# Patient Record
Sex: Male | Born: 1975 | Race: White | Hispanic: No | Marital: Married | Smoking: Never smoker
Health system: Southern US, Community
[De-identification: ages and names within clinical notes are randomized; demographics above are authoritative.]

## PROBLEM LIST (undated history)

## (undated) DIAGNOSIS — J45909 Unspecified asthma, uncomplicated: Secondary | ICD-10-CM

## (undated) DIAGNOSIS — M549 Dorsalgia, unspecified: Secondary | ICD-10-CM

## (undated) DIAGNOSIS — T7840XA Allergy, unspecified, initial encounter: Secondary | ICD-10-CM

## (undated) HISTORY — DX: Allergy, unspecified, initial encounter: T78.40XA

## (undated) HISTORY — DX: Unspecified asthma, uncomplicated: J45.909

## (undated) HISTORY — DX: Dorsalgia, unspecified: M54.9

## (undated) HISTORY — PX: TONSILLECTOMY: SUR1361

---

## 2014-07-18 ENCOUNTER — Encounter: Payer: Self-pay | Admitting: Rehabilitative and Restorative Service Providers"

## 2014-07-18 ENCOUNTER — Ambulatory Visit (INDEPENDENT_AMBULATORY_CARE_PROVIDER_SITE_OTHER): Payer: Managed Care, Other (non HMO) | Admitting: Rehabilitative and Restorative Service Providers"

## 2014-07-18 DIAGNOSIS — M623 Immobility syndrome (paraplegic): Secondary | ICD-10-CM

## 2014-07-18 DIAGNOSIS — Z7409 Other reduced mobility: Secondary | ICD-10-CM | POA: Diagnosis not present

## 2014-07-18 DIAGNOSIS — M5410 Radiculopathy, site unspecified: Secondary | ICD-10-CM

## 2014-07-18 DIAGNOSIS — M2560 Stiffness of unspecified joint, not elsewhere classified: Secondary | ICD-10-CM

## 2014-07-18 NOTE — Therapy (Signed)
[facility] [address] [place], [place], [address] Phone: [phone]   Fax:  [phone]  Physical Therapy Evaluation  Patient Details  Name: Tyler CapriceKier Tyler Chambers MRN: [id number] Date of Birth: [birth year] Referring Provider:  [place]il[place], Betty G, MD  Encounter Date: 07/18/2014      PT End of Session - 07/18/14 0718    Visit Number 1   Number of Visits 6   Date for PT Re-Evaluation 09/12/14   PT Start Time 0719   PT Stop Time 0809   PT Time Calculation (min) 50 min   Activity Tolerance Patient tolerated treatment well   Behavior During Therapy [facility] for tasks assessed/performed      History reviewed. No pertinent past medical history.  History reviewed. No pertinent past surgical history.  There were no vitals filed for this visit.  Visit Diagnosis:  Radicular pain of lower extremity - Plan: PT plan of care cert/re-cert  Stiffness due to immobility - Plan: PT plan of care cert/re-cert  Decreased functional mobility and endurance - Plan: PT plan of care cert/re-cert      Subjective Assessment - 07/18/14 0719    Subjective Patient reports onset of Lt leg pain and numbness in the back of his Lt LE mid May. Symptoms have continued since that time. He seen by MD and treated with predisone and Meloxicam with good resolution of pain but numbness continues. Numbness is intermittent more noticable in the morning.   Pertinent History Denies any medical problems - does have asthma   Limitations Sitting   How long can you sit comfortably? In the am ~15 min   How long can you stand comfortably? no limit   How long can you walk comfortably? no limit   Diagnostic tests none   Patient Stated Goals Get rid of pain and numbness in his Lt leg   Currently in Pain? Yes   Pain Score 2    Pain Location Leg   Pain Orientation Left;Posterior   Pain Descriptors / Indicators Aching;Tightness;Numbness   Pain Type Acute pain   Pain Radiating  Towards hip to leg to plantar surface of foot   Pain Onset More than a month ago   Aggravating Factors  sitting on stool or sofa/car seat   Pain Relieving Factors changing positions    Effect of Pain on Daily Activities has to get up and change positions frequently - avoids sitting   Multiple Pain Sites No            [facility] PT Assessment - 07/18/14 0001    Assessment   Medical Diagnosis Lt LE radicular pain   Onset Date/Surgical Date 05/27/14   Hand Dominance Right   Next MD Visit no scheduled appt   Balance Screen   Has the patient fallen in the past 6 months No   Has the patient had a decrease in activity level because of a fear of falling?  No   Is the patient reluctant to leave their home because of a fear of falling?  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   Leisure Some household chores/camping/hiking/walking/some exercise    Observation/Other Assessments   Focus on Therapeutic Outcomes (FOTO)  2% limitation   Sensation   Additional Comments sensation intact to light  pressure   Posture/Postural Control   Posture Comments slight head forward posture with scapulae abducted and rotated along the toracic spine; UE's internally rotated at sides; symmetrical boney landmarks   AROM   Overall AROM Comments ROM WFL's throughout Bilat LE's tightness in hamstrings at 80 degrees Lt, 85 degrees Rt   Lumbar Flexion 80%   Lumbar Extension 75%   Lumbar - Right Side Bend 85%   Lumbar - Left Side Bend 85%   Lumbar - Right Rotation 80%   Lumbar - Left Rotation 80%   Strength   Overall Strength Comments WFL's throughout   Flexibility   Soft Tissue Assessment /Muscle Length --  tight piriformis Lt > Rt   Palpation   Palpation comment PA  spring testing negative          [facility] Adult PT Treatment/Exercise - 07/18/14 0001    Lumbar Exercises: Stretches   Passive Hamstring Stretch 3 reps;30 seconds   Press Ups --  10 reps 1-2 sec pause   Piriformis Stretch 3 reps;20 seconds   Lumbar Exercises: Standing   Other Standing Lumbar Exercises trunk extension 5-10 reps    Modalities   Modalities Cryotherapy   Cryotherapy   Number Minutes Cryotherapy 12 Minutes   Cryotherapy Location Lumbar Spine   Type of Cryotherapy Ice pack            PT Education - 07/18/14 0809    Education provided Yes   Education Details Educatioin re- back pain and radicular symptoms; back care; ergonomics; HEP   Person(s) Educated Patient   Methods Explanation;Demonstration;Tactile cues;Verbal cues;Handout   Comprehension Verbalized understanding;Returned demonstration;Verbal cues required;Tactile cues required          PT Short Term Goals - 07/18/14 0834    PT SHORT TERM GOAL #1   Title Patient I in HEP(08/08/14)   Time 3   Period Weeks   Status New   PT SHORT TERM GOAL #2   Title Patient reports modifications in work positions to [employer] improved ergonomic postures(08/08/14)   Time 3   Period Weeks   Status New           PT Long Term Goals - 07/18/14 0836    PT LONG TERM GOAL #1   Title Patient I in advance HEP including core stabilization and stretching(09/12/14)   Time 6   Period Weeks   Status New   PT LONG TERM GOAL #2   Title Resolution of radicular Lt LE pain(09/12/14)   Time 6   Period Weeks   Status New   PT LONG TERM GOAL #3   Title Maintain FOTO score(09/12/14)             Plan - 07/18/14 0829    Clinical Impression Statement Patient presents with 2 month history of Lt LE radicular pain and numbness with signs and symptoms indicative of discogenic origin. He has decreased pain with lumbar extension in standing and prone; hamstring/piriformis tightness Lt > Rt; poor work postures and positions    Pt will benefit from skilled therapeutic intervention in order to improve on the following deficits Decreased range of motion;Postural dysfunction;Improper body mechanics;Pain   Rehab Potential Excellent   PT Frequency 1x / week   PT Duration 6 weeks   PT Treatment/Interventions ADLs/Self Care Home Management;Patient/family education;Therapeutic activities;Therapeutic exercise;Neuromuscular re-education;Manual techniques;Moist Heat;Ultrasound;Cryotherapy;Electrical Stimulation   PT Next Visit Plan Review HEP; progress with core stabilization for lumbar spine; continue education re- back care   PT Home Exercise Plan Postural  changes; positonal correction for work at home; standing trunk extension for onset of radicular symptoms; HEP   Consulted and Agree with Plan of Care Patient         Problem List There are no active problems to display for this patient.   Val Riles, PT, MPH 07/18/2014, 8:46 AM  [facility] [address] [place], [place], [address] Phone: [phone]   Fax:  [phone]

## 2014-07-18 NOTE — Patient Instructions (Signed)
"  Let's Twist" Stretch   With both legs bent and feet flat on floor, cross one leg over. Keeping both shoulders on floor, pull knee across opposite leg. Hold 20 sec. Repeat _3___ times. Do ____ sessions per day.   Hamstring Step 1   Straighten left knee. Keep knee level with other knee or on bolster. Hold _30__ seconds. Relax knee by returning foot to start. Repeat 3___ times.     Trunk Extension   Standing, place back of open hands on low back. Straighten spine then arch the back and move shoulders back. Repeat _5- 10___ times per session. Do ____ sessions per week. Variation: Seated   Trunk: Prone Extension (Press-Ups)   Lie on stomach on firm, flat surface. Relax bottom and legs. Raise chest in air with elbows straight. Keep hips flat on surface, sag stomach. Hold _2-3___ seconds. Repeat _10__ times. Do _2-3___ sessions per day. CAUTION: Movement should be gentle and slow.  .Marland Kitchen

## 2014-07-30 ENCOUNTER — Encounter: Payer: Self-pay | Admitting: Rehabilitative and Restorative Service Providers"

## 2014-07-30 ENCOUNTER — Ambulatory Visit (INDEPENDENT_AMBULATORY_CARE_PROVIDER_SITE_OTHER): Payer: Managed Care, Other (non HMO) | Admitting: Rehabilitative and Restorative Service Providers"

## 2014-07-30 ENCOUNTER — Encounter (INDEPENDENT_AMBULATORY_CARE_PROVIDER_SITE_OTHER): Payer: Self-pay

## 2014-07-30 DIAGNOSIS — M5410 Radiculopathy, site unspecified: Secondary | ICD-10-CM

## 2014-07-30 DIAGNOSIS — M623 Immobility syndrome (paraplegic): Secondary | ICD-10-CM | POA: Diagnosis not present

## 2014-07-30 DIAGNOSIS — M2560 Stiffness of unspecified joint, not elsewhere classified: Secondary | ICD-10-CM

## 2014-07-30 DIAGNOSIS — Z7409 Other reduced mobility: Secondary | ICD-10-CM

## 2014-07-30 NOTE — Patient Instructions (Signed)
Sag with prone press up  Abdominal Bracing With Pelvic Floor (Hook-Lying) 3 part core   With neutral spine, tighten pelvic floor and abdominals.Hold 10 sec  Repeat _10__ times. Do _several__ times a day. Transition to sitting and standing     Bridging   Slowly raise buttocks from floor, keeping stomach tight. Hold 5 sec Repeat __10__ times per set.  Do _1___ sessions per day.     Strengthening: Wall Slide   Leaning on wall, slowly lower buttocks until thighs are parallel to floor. Hold _20-30___ seconds. Tighten thigh muscles and return. Repeat _10___ times per set.  Do _1___ sessions per day.  .  Weight on legs not arms with doorway stretches    Scapula Adduction With Pectorals, Low   Stand in doorframe with palms against frame and arms at 45. Lean forward and squeeze shoulder blades. Hold __30_ seconds. Repeat _3__ times per session. Do __3-4 sessions per day.     Scapula Adduction With Pectorals, Mid-Range   Stand in doorframe with palms against frame and arms at 90. Lean forward and squeeze shoulder blades. Hold _30_ seconds. Repeat _3__ times per session. Do _3-4__ sessions per day.   \Scapula Adduction With Pectorals, High   Stand in doorframe with palms against frame and arms at 120. Lean forward and squeeze shoulder blades. Hold _30__ seconds. Repeat 3___ times per session. Do _3-4__ sessions per day.

## 2014-07-30 NOTE — Therapy (Signed)
[place], [place], [address] Phone: [phone]   Fax:  [phone]  Physical Therapy Treatment  Patient Details  Name: Tyler Chambers MRN: [id number] Date of Birth: 1975-12-17 Referring Provider:  [place], Betty G, MD  Encounter Date: 07/30/2014      PT End of Session - 07/30/14 1226    Visit Number 2   Number of Visits 6   Date for PT Re-Evaluation 09/12/14   PT Start Time 0704   PT Stop Time 0733   PT Time Calculation (min) 29 min   Activity Tolerance Patient tolerated treatment well;No increased pain      History reviewed. No pertinent past medical history.  History reviewed. No pertinent past surgical history.  There were no vitals filed for this visit.  Visit Diagnosis:  Radicular pain of lower extremity  Stiffness due to immobility  Decreased functional mobility and endurance      Subjective Assessment - 07/30/14 0706    Subjective Patient reports that his symptoms are better when he maintains good posture and alignment. He notices that symproms return when he slumps - for example when sitting at his laptop on the sofa. Made some modifications with positions. Feels ready for discharge from PT and will continue with I HEP    Limitations Sitting   How long can you sit comfortably? in good posture - 20-30 min   Currently in Pain? No/denies            [facility] PT Assessment - 07/30/14 0001    Observation/Other Assessments   Focus on Therapeutic Outcomes (FOTO)  12% limitation    AROM   Overall AROM Comments hamstring tightness~ 85 degrees bilat   Lumbar Flexion 85%   Lumbar Extension 80%                     [facility] Adult PT Treatment/Exercise - 07/30/14 0001    Posture/Postural Control   Posture Comments improved standing and sitting posture observed   Lumbar Exercises: Stretches   Passive Hamstring Stretch 3 reps;30 seconds   Press Ups --  10 reps 1-2 sec pause    Press Ups Limitations with sag at full extension   Piriformis Stretch Limitations doorway pec stretch 3 positions 20-30 sec hold 3 reps each position    Lumbar Exercises: Standing   Wall Slides 10 reps;5 seconds   Other Standing Lumbar Exercises trunk extension 5-10 reps    Lumbar Exercises: Supine   AB Set Limitations 3 part core 10 x10 sec   Bridge 10 reps;5 seconds                PT Education - 07/30/14 1225    Education provided Yes   Education Details continued education re- spine health; exercise; HEP   Person(s) Educated Patient   Methods Explanation;Demonstration;Tactile cues;Verbal cues;Handout   Comprehension Verbalized understanding;Returned demonstration;Verbal cues required;Tactile cues required          PT Short Term Goals - 07/30/14 1230    PT SHORT TERM GOAL #1   Title Patient I in HEP(08/08/14)   Time 3   Period Weeks   Status Achieved   PT SHORT TERM GOAL #2   Title Patient reports modifications in work positions to [employer] improved ergonomic postures(08/08/14)   Time 3   Period Weeks   Status Achieved           PT Long Term Goals - 07/30/14 1230    PT LONG  TERM GOAL #1   Title Patient I in advance HEP including core stabilization and stretching(09/12/14)   Baseline Patient instructed in core stabilization and will continue with HEP   Time 6   Period Weeks   Status On-going   PT LONG TERM GOAL #2   Title Resolution of radicular Lt LE pain(09/12/14)   Baseline Decreased frequency, intensity and duration of radicular LE pain.   Time 6   Period Weeks   Status On-going   PT LONG TERM GOAL #3   Title Maintain FOTO score(09/12/14)   Status Not Met               Plan - 07/30/14 1226    Clinical Impression Statement Patient reports good improvement in LBP and radicular pain into LE with exercises and remembering to correct spinal posture. He has less frezuent symptoms and can relieve symptoms quickly when they occur. He is  confident in continuing with I HEP/postural correction. He added lumbar stabilization to home program today.   Pt will benefit from skilled therapeutic intervention in order to improve on the following deficits Decreased range of motion;Postural dysfunction;Improper body mechanics;Pain   Rehab Potential Excellent   PT Frequency 1x / week   PT Duration 6 weeks   PT Treatment/Interventions ADLs/Self Care Home Management;Patient/family education;Therapeutic activities;Therapeutic exercise;Neuromuscular re-education;Manual techniques;Moist Heat;Ultrasound;Cryotherapy;Electrical Stimulation   PT Next Visit Plan D/C to I HEP patient will call with any questions or problems.    PT Home Exercise Plan Postural changes; positonal correction for work at home; standing trunk extension for onset of radicular symptoms; added core stabilization; HEP   Consulted and Agree with Plan of Care Patient        Problem List There are no active problems to display for this patient.   Everardo All, PT, MPH 07/30/2014, 12:32 PM  [facility] [place], [place], [address] Phone: [phone]   Fax:  [phone]  PHYSICAL THERAPY DISCHARGE SUMMARY  Visits from Start of Care: 2  Current functional level related to goals / functional outcomes: Patient I in HEP and feels confident in continuing with home program to address lumbar dysfunction and radicular symptoms   Remaining deficits: Continues to have intermittent radicular pain but frequency, intensity and duration are decreased. Patient hs made ergonomic corrections and is working on his [employer] / Equipment: HEP  Plan: Patient agrees to discharge.  Patient goals were partially met. Patient is being discharged due to the patient's request.  ?????   Taelor Waymire P.Helene Kelp, PT, MPH 07/30/14 12:36 pm

## 2015-06-13 ENCOUNTER — Ambulatory Visit: Payer: Managed Care, Other (non HMO) | Admitting: Family Medicine

## 2016-07-20 ENCOUNTER — Encounter: Payer: Self-pay | Admitting: Family Medicine

## 2016-07-20 ENCOUNTER — Ambulatory Visit (INDEPENDENT_AMBULATORY_CARE_PROVIDER_SITE_OTHER): Payer: 59 | Admitting: Family Medicine

## 2016-07-20 ENCOUNTER — Ambulatory Visit (INDEPENDENT_AMBULATORY_CARE_PROVIDER_SITE_OTHER)
Admission: RE | Admit: 2016-07-20 | Discharge: 2016-07-20 | Disposition: A | Discharge status: Home / Self Care | Payer: 59 | Source: Ambulatory Visit | Attending: Family Medicine | Admitting: Family Medicine

## 2016-07-20 VITALS — BP 110/70 | HR 53 | Resp 12 | Ht 73.0 in | Wt 207.1 lb

## 2016-07-20 DIAGNOSIS — R001 Bradycardia, unspecified: Secondary | ICD-10-CM

## 2016-07-20 DIAGNOSIS — M549 Dorsalgia, unspecified: Secondary | ICD-10-CM

## 2016-07-20 DIAGNOSIS — L219 Seborrheic dermatitis, unspecified: Secondary | ICD-10-CM | POA: Insufficient documentation

## 2016-07-20 DIAGNOSIS — G8929 Other chronic pain: Secondary | ICD-10-CM | POA: Insufficient documentation

## 2016-07-20 DIAGNOSIS — M545 Low back pain: Secondary | ICD-10-CM

## 2016-07-20 DIAGNOSIS — J4520 Mild intermittent asthma, uncomplicated: Secondary | ICD-10-CM | POA: Diagnosis not present

## 2016-07-20 MED ORDER — CLOTRIMAZOLE-BETAMETHASONE 1-0.05 % EX CREA: 1.0000 "application " | TOPICAL_CREAM | Freq: Two times a day (BID) | CUTANEOUS | 1 refills | Status: DC

## 2016-07-20 MED ORDER — KETOCONAZOLE 2 % EX SHAM: 1.0000 "application " | MEDICATED_SHAMPOO | CUTANEOUS | 1 refills | Status: AC

## 2016-07-20 MED ORDER — ALBUTEROL SULFATE HFA 108 (90 BASE) MCG/ACT IN AERS: 2.0000 | INHALATION_SPRAY | Freq: Four times a day (QID) | RESPIRATORY_TRACT | 2 refills | Status: DC | PRN

## 2016-07-20 NOTE — Patient Instructions (Addendum)
A few things to remember from today's visit:   Bradycardia - Plan: EKG 12-Lead  Chronic bilateral low back pain, with sciatica presence unspecified - Plan: DG Lumbar Spine Complete  Asthma, intermittent, uncomplicated - Plan: albuterol (PROVENTIL HFA;VENTOLIN HFA) 108 (90 Base) MCG/ACT inhaler  Seborrheic dermatitis - Plan: clotrimazole-betamethasone (LOTRISONE) cream, ketoconazole (NIZORAL) 2 % shampoo  Sinus bradycardia, I do not think further studies are needed at this time.    Please be sure medication list is accurate. If a new problem present, please set up appointment sooner than planned today.

## 2016-07-20 NOTE — Progress Notes (Signed)
ACUTE VISIT:  HPI:  Chief Complaint  Patient presents with  . back issue  . Eczema    [name] is a 41 y.o. male, who is here today complaining of pruritic rash on chest and right eye brow mainly, "red patches."  I saw [name] about 2 years ago at [facility], [place]  He has had rash for a while,1-2 years,  exacerbated by hot weather and sweating. OTC Cetaphil and Clotrimazole help temporarily with rash. He also has noted pruritus on neck and behind the ears sometimes but has not noted rash.   Rash  This is a chronic problem. The problem has been waxing and waning since onset. The affected locations include the torso and face. Pertinent negatives include no congestion, cough, diarrhea, eye pain, facial edema, fatigue, fever, joint pain, nail changes, shortness of breath, sore throat or vomiting. Past treatments include moisturizer. The treatment provided moderate relief. His past medical history is significant for allergies and asthma.    Back pain: Chronic. He completed PT and noted great improvement. According to pt, his wife recommended appt because she has noted that he kneels when he is going to unload the dishwasher. She thinks that his back is bothering him but he has not had issues with his back lately.   Pain is not radiated but used to radiated to LE. Achy like pain from 0-1/10, no associated LE numbness, tingling, urinary incontinence or retention, stool incontinence, or saddle anesthesia.   Exacerbated by prolonged sitting. Alleviated by movement and exercise. Sine he is exercising regularly his back pain has improved.  No rash or edema on area, fever, chills, or abnormal wt loss.    OTC medications: Seldom Ibuprofen.  Asthma: He also needs a refill on his Albuterol inh. Exacerbations are very occasional. Cold weather and viral illness exacerbate symptoms. Nocturnal symptoms 1-2 times per year.  Today on examination noted mild  bradycardia. Denies chest pain, dyspnea, palpitation, claudication, focal weakness, or edema.   Review of Systems  Constitutional: Negative for appetite change, fatigue and fever.  HENT: Negative for congestion, mouth sores, nosebleeds and sore throat.   Eyes: Negative for pain, redness and visual disturbance.  Respiratory: Negative for cough, shortness of breath and wheezing.   Cardiovascular: Negative for chest pain, palpitations and leg swelling.  Gastrointestinal: Negative for abdominal pain, diarrhea, nausea and vomiting.       No changes in bowel habits.  Genitourinary: Negative for decreased urine volume and hematuria.  Musculoskeletal: Positive for back pain. Negative for gait problem, joint pain and neck pain.  Skin: Positive for rash. Negative for nail changes and wound.  Allergic/Immunologic: Positive for environmental allergies.  Neurological: Negative for syncope, light-headedness, numbness and headaches.  Hematological: Negative for adenopathy. Does not bruise/bleed easily.  Psychiatric/Behavioral: Negative for confusion. The patient is not nervous/anxious.     No current outpatient prescriptions on file prior to visit.   No current facility-administered medications on file prior to visit.     Past Medical History:  Diagnosis Date  . Allergy   . Asthma   . Back pain    Not on File  Social History   Social History  . Marital status: Married    Spouse name: N/A  . Number of children: N/A  . Years of education: N/A   Social History Main Topics  . Smoking status: Never Smoker  . Smokeless tobacco: Never Used  . Alcohol use Yes  . Drug use: No  .  Sexual activity: Not Asked   Other Topics Concern  . None   Social History Narrative  . None    Vitals:   07/20/16 1513  BP: 110/70  Pulse: (!) 53  Resp: 12  O2 sat at RA 98% Body mass index is 27.33 kg/m.   Physical Exam  Nursing note and vitals reviewed. Constitutional: He is oriented to person,  place, and time. He appears well-developed and well-nourished. No distress.  HENT:  Head: Atraumatic.  Mouth/Throat: Oropharynx is clear and moist and mucous membranes are normal.  Eyes: Conjunctivae and EOM are normal. Pupils are equal, round, and reactive to light.    Pigmentation left eye, unchanged.  Neck: No tracheal deviation present. No thyroid mass and no thyromegaly present.  Cardiovascular: Regular rhythm.   No extrasystoles are present. Bradycardia present.   No murmur heard. Pulses:      Dorsalis pedis pulses are 2+ on the right side, and 2+ on the left side.  Respiratory: Effort normal and breath sounds normal. No respiratory distress.  GI: Soft. He exhibits no mass. There is no hepatomegaly. There is no tenderness.  Musculoskeletal: He exhibits no edema.       Thoracic back: He exhibits no tenderness and no bony tenderness.       Lumbar back: He exhibits normal range of motion, no tenderness and no bony tenderness.  Lymphadenopathy:    He has no cervical adenopathy.  Neurological: He is alert and oriented to person, place, and time. He has normal strength. Gait normal.  Reflex Scores:      Patellar reflexes are 2+ on the right side and 2+ on the left side. SLR negative bilateral.  Skin: Skin is warm. Rash noted. Rash is macular. Rash is not papular and not vesicular. There is erythema.  Upper sternal area with maculopapular confluent erythematous rash. Scaly macular ,erythematous lesion inner aspect right eye brow. Smaller lesion on anterior neck and post inflammatory pigmentation changes retro auricular.   Psychiatric: He has a normal mood and affect.  Well groomed, good eye contact.    ASSESSMENT AND PLAN:   [name] was seen today for back issue and eczema.  Diagnoses and all orders for this visit:  Asthma, intermittent, uncomplicated  Well controlled. No changes in current management. F/U in 12 months.  -     albuterol (PROVENTIL HFA;VENTOLIN HFA) 108 (90  Base) MCG/ACT inhaler; Inhale 2 puffs into the lungs every 6 (six) hours as needed for wheezing or shortness of breath.  Bradycardia  Asymptomatic. EKG today: sinus bradycardia, sinus arrhythmia (?),normal axis and intervals. No other EKG available for comparison. We will continue monitoring. No further work up recommended today. Instructed about warning sings.  -     EKG 12-Lead  Chronic bilateral low back pain, with sciatica presence unspecified  Improved after PT. He has not had imaging before,so plain lumbar X ray ordered today. Continue regular exercise, injury prevention discussed,as well as ergonomic posture to prevent exacerbations. F/U as needed.  -     DG Lumbar Spine Complete; Future  Seborrheic dermatitis  Topical treatment recommended as needed. Dx discussed. F/U as needed.  -     clotrimazole-betamethasone (LOTRISONE) cream; Apply 1 application topically 2 (two) times daily. -     ketoconazole (NIZORAL) 2 % shampoo; Apply 1 application topically 2 (two) times a week.     -[name] was advised to seek immediate medical attention is symptoms suddenly get worse or to follow if problem persists or  new concerns arise.     Tyler Reece G. [place], MD  [facility]. [place] office.

## 2016-07-21 ENCOUNTER — Encounter: Payer: Self-pay | Admitting: Family Medicine

## 2017-06-16 ENCOUNTER — Ambulatory Visit: Payer: Self-pay | Admitting: Family Medicine

## 2017-06-16 ENCOUNTER — Encounter: Payer: Self-pay | Admitting: Family Medicine

## 2017-06-16 VITALS — BP 118/84 | HR 72 | Temp 98.0°F | Resp 12 | Ht 73.0 in | Wt 210.2 lb

## 2017-06-16 DIAGNOSIS — J4520 Mild intermittent asthma, uncomplicated: Secondary | ICD-10-CM | POA: Insufficient documentation

## 2017-06-16 DIAGNOSIS — Z0289 Encounter for other administrative examinations: Secondary | ICD-10-CM | POA: Diagnosis not present

## 2017-06-16 DIAGNOSIS — L219 Seborrheic dermatitis, unspecified: Secondary | ICD-10-CM | POA: Diagnosis not present

## 2017-06-16 MED ORDER — ALBUTEROL SULFATE HFA 108 (90 BASE) MCG/ACT IN AERS: 2.0000 | INHALATION_SPRAY | Freq: Four times a day (QID) | RESPIRATORY_TRACT | 3 refills | Status: AC | PRN

## 2017-06-16 MED ORDER — CLOTRIMAZOLE-BETAMETHASONE 1-0.05 % EX CREA: 1.0000 "application " | TOPICAL_CREAM | Freq: Two times a day (BID) | CUTANEOUS | 1 refills | Status: DC

## 2017-06-16 NOTE — Assessment & Plan Note (Signed)
Stable overall and symptoms well controlled with Lotrisone cream daily as needed. No changes in current management. He can continue following annually, before if needed.

## 2017-06-16 NOTE — Patient Instructions (Signed)
A few things to remember from today's visit:   Seborrheic dermatitis - Plan: clotrimazole-betamethasone (LOTRISONE) cream  Asthma, intermittent, uncomplicated - Plan: albuterol (PROVENTIL HFA;VENTOLIN HFA) 108 (90 Base) MCG/ACT inhaler   Please be sure medication list is accurate. If a new problem present, please set up appointment sooner than planned today.

## 2017-06-16 NOTE — Assessment & Plan Note (Signed)
Well-controlled. No changes in Albuterol inhaler 2 puff every 4-6 hours as needed for symptoms. Follow-up in a year, before if needed.

## 2017-06-16 NOTE — Progress Notes (Signed)
HPI:  Chief Complaint  Patient presents with  . Form Completion    form for foster care application    Tyler Chambers is a 42 y.o. male, who is here today with his wife requesting foster care form to be completed. He and his wife are planning on applying for foster parents, they are supposed to attend a class before they go through the process. Age range between 3510 and 43 years old.  He does not have respiratory symptoms or exposure to any communicable disease, no limitation in physical activity, or history of psychiatric disorders.  He lives with his wife. No history of DM, hypertension, or hyperlipidemia.  History of asthma, he has a few exacerbations during the year. He is currently on Albuterol inhaler as needed. He is not having cough, wheezing, or dyspnea.  Seborrheic dermatitis: Currently he is on Lotrisone cream, which he applies daily as needed. Currently he does not have skin lesions.   Review of Systems  Constitutional: Negative for activity change, appetite change, fatigue, fever and unexpected weight change.  HENT: Negative for nosebleeds, sore throat and trouble swallowing.   Respiratory: Negative for cough, shortness of breath and wheezing.   Cardiovascular: Negative for chest pain, palpitations and leg swelling.  Gastrointestinal: Negative for abdominal pain, nausea and vomiting.  Genitourinary: Negative for decreased urine volume, dysuria and hematuria.  Musculoskeletal: Negative for gait problem and myalgias.  Skin: Negative for rash.  Allergic/Immunologic: Positive for environmental allergies.  Neurological: Negative for weakness and headaches.  Psychiatric/Behavioral: Negative for confusion. The patient is not nervous/anxious.      Current Outpatient Medications on File Prior to Visit  Medication Sig Dispense Refill  . ketoconazole (NIZORAL) 2 % shampoo Apply 1 application topically 2 (two) times a week. 120 mL 1   No current  facility-administered medications on file prior to visit.      Past Medical History:  Diagnosis Date  . Allergy   . Asthma   . Back pain    Not on File  Social History   Socioeconomic History  . Marital status: Married    Spouse name: Not on file  . Number of children: Not on file  . Years of education: Not on file  . Highest education level: Not on file  Occupational History  . Not on file  Social Needs  . Financial resource strain: Not on file  . Food insecurity:    Worry: Not on file    Inability: Not on file  . Transportation needs:    Medical: Not on file    Non-medical: Not on file  Tobacco Use  . Smoking status: Never Smoker  . Smokeless tobacco: Never Used  Substance and Sexual Activity  . Alcohol use: Yes  . Drug use: No  . Sexual activity: Not on file  Lifestyle  . Physical activity:    Days per week: Not on file    Minutes per session: Not on file  . Stress: Not on file  Relationships  . Social connections:    Talks on phone: Not on file    Gets together: Not on file    Attends religious service: Not on file    Active member of club or organization: Not on file    Attends meetings of clubs or organizations: Not on file    Relationship status: Not on file  Other Topics Concern  . Not on file  Social History Narrative  . Not on file  Vitals:   06/16/17 0958  BP: 118/84  Pulse: 72  Resp: 12  Temp: 98 F (36.7 C)  SpO2: 98%   Body mass index is 27.74 kg/m.   Physical Exam  Nursing note and vitals reviewed. Constitutional: He is oriented to person, place, and time. He appears well-developed. No distress.  HENT:  Head: Normocephalic and atraumatic.  Mouth/Throat: Oropharynx is clear and moist and mucous membranes are normal.  Eyes: Pupils are equal, round, and reactive to light. Conjunctivae and EOM are normal.  Cardiovascular: Normal rate and regular rhythm.  No murmur heard. Pulses:      Dorsalis pedis pulses are 2+ on the right  side, and 2+ on the left side.  Respiratory: Effort normal and breath sounds normal. No respiratory distress.  GI: Soft. He exhibits no mass. There is no hepatomegaly. There is no tenderness.  Musculoskeletal: He exhibits no edema or tenderness.  Lymphadenopathy:    He has no cervical adenopathy.  Neurological: He is alert and oriented to person, place, and time. He has normal strength.  Skin: Skin is warm. No rash noted. No erythema.  Psychiatric: He has a normal mood and affect. Cognition and memory are normal.  Well groomed, good eye contact.     ASSESSMENT AND PLAN:  Tyler Chambers was seen today for form completion.  Diagnoses and all orders for this visit:  Encounter for physical examination of prospective foster parent  Based on medical history and examination today he does not have any impairment to become foster parents. No risk factors for TB, so screening was not recommended. Form completed.  Seborrheic dermatitis Stable overall and symptoms well controlled with Lotrisone cream daily as needed. No changes in current management. He can continue following annually, before if needed.  Asthma, intermittent, uncomplicated Well-controlled. No changes in Albuterol inhaler 2 puff every 4-6 hours as needed for symptoms. Follow-up in a year, before if needed.      Adolphe Fortunato G. [place], MD  [facility]. [place] office.

## 2017-09-01 ENCOUNTER — Ambulatory Visit (INDEPENDENT_AMBULATORY_CARE_PROVIDER_SITE_OTHER): Payer: 59 | Admitting: Psychology

## 2017-09-01 DIAGNOSIS — F418 Other specified anxiety disorders: Secondary | ICD-10-CM

## 2017-09-22 ENCOUNTER — Ambulatory Visit (INDEPENDENT_AMBULATORY_CARE_PROVIDER_SITE_OTHER): Payer: 59 | Admitting: Psychology

## 2017-09-22 DIAGNOSIS — F418 Other specified anxiety disorders: Secondary | ICD-10-CM

## 2017-10-13 ENCOUNTER — Ambulatory Visit: Payer: 59 | Admitting: Psychology

## 2017-10-27 ENCOUNTER — Ambulatory Visit (INDEPENDENT_AMBULATORY_CARE_PROVIDER_SITE_OTHER): Payer: 59 | Admitting: Psychology

## 2017-10-27 DIAGNOSIS — F418 Other specified anxiety disorders: Secondary | ICD-10-CM | POA: Diagnosis not present

## 2017-11-08 ENCOUNTER — Ambulatory Visit: Payer: Self-pay | Admitting: Family Medicine

## 2017-11-10 ENCOUNTER — Ambulatory Visit: Payer: 59 | Admitting: Psychology

## 2017-11-10 ENCOUNTER — Ambulatory Visit: Payer: Managed Care, Other (non HMO) | Admitting: Family Medicine

## 2017-11-10 ENCOUNTER — Encounter: Payer: Self-pay | Admitting: Family Medicine

## 2017-11-10 VITALS — BP 110/70 | HR 73 | Temp 98.5°F | Resp 12 | Wt 214.4 lb

## 2017-11-10 DIAGNOSIS — F411 Generalized anxiety disorder: Secondary | ICD-10-CM | POA: Diagnosis not present

## 2017-11-10 DIAGNOSIS — Z23 Encounter for immunization: Secondary | ICD-10-CM

## 2017-11-10 MED ORDER — SERTRALINE HCL 50 MG PO TABS: ORAL_TABLET | ORAL | 1 refills | Status: DC

## 2017-11-10 NOTE — Patient Instructions (Signed)
A few things to remember from today's visit:   GAD (generalized anxiety disorder) - Plan: sertraline (ZOLOFT) 50 MG tablet  Today we started Sertraline, this type of medications can increase suicidal risk. This is more prevalent among children,adolecents, and young adults with major depression or other psychiatric disorders. It can also make depression worse. Most common side effects are gastrointestinal, self limited after a few weeks: diarrhea, nausea, constipation  Or diarrhea among some.  In general it is well tolerated. We will follow closely.      Please be sure medication list is accurate. If a new problem present, please set up appointment sooner than planned today.

## 2017-11-10 NOTE — Progress Notes (Signed)
ACUTE VISIT   HPI:  Chief Complaint  Patient presents with  . Anxiety    Tyler Chambers is a 42 y.o. male, who is here today complaining of worsening anxiety.  He states that he has "always been a worrier" but for the past few months he feels like symptoms are getting worse, his wife has noted changes and recommended establishing with psychotherapist.  He is seen psychotherapist every 2 weeks, which has helped.  According to patient, he was screened for depression, PHQ 9 was 3 and anxiety inventory 9 out of 10.  He was recommended to follow with PCP to discuss pharmacologic treatment.   Problem seems to be exacerbated by work, he is a professor at [employer].  Deadlines and requirements he has to meet in order to keep his job are main trigger factors. He is waking up a couple times during the night and having a hard time going back to sleep.  Intermittent episodes of acute anxiety, he cannot explain symptoms. States that he feels generalized tension, "pumping adrenaline", "heart crunching", chest discomfort symptoms resolved after breathing exercise and relaxation techniques.   He lives with his wife.  He exercises regularly and follows a healthy diet. He denies exertional chest pain, dyspnea, or diaphoresis.   Review of Systems  Constitutional: Negative for activity change, appetite change and fatigue.  HENT: Negative for mouth sores, sore throat and trouble swallowing.   Respiratory: Positive for chest tightness. Negative for shortness of breath and wheezing.   Cardiovascular: Negative for palpitations.  Gastrointestinal: Negative for abdominal pain, nausea and vomiting.  Neurological: Negative for syncope, weakness and headaches.  Psychiatric/Behavioral: Positive for sleep disturbance. Negative for confusion, hallucinations and suicidal ideas. The patient is nervous/anxious.       Current Outpatient Medications on File Prior to Visit    Medication Sig Dispense Refill  . albuterol (PROVENTIL HFA;VENTOLIN HFA) 108 (90 Base) MCG/ACT inhaler Inhale 2 puffs into the lungs every 6 (six) hours as needed for wheezing or shortness of breath. 1 Inhaler 3  . clotrimazole-betamethasone (LOTRISONE) cream Apply 1 application topically 2 (two) times daily. 45 g 1  . ketoconazole (NIZORAL) 2 % shampoo Apply 1 application topically 2 (two) times a week. 120 mL 1   No current facility-administered medications on file prior to visit.      Past Medical History:  Diagnosis Date  . Allergy   . Asthma   . Back pain    No Known Allergies  Social History   Socioeconomic History  . Marital status: Married    Spouse name: Not on file  . Number of children: Not on file  . Years of education: Not on file  . Highest education level: Not on file  Occupational History  . Not on file  Social Needs  . Financial resource strain: Not on file  . Food insecurity:    Worry: Not on file    Inability: Not on file  . Transportation needs:    Medical: Not on file    Non-medical: Not on file  Tobacco Use  . Smoking status: Never Smoker  . Smokeless tobacco: Never Used  Substance and Sexual Activity  . Alcohol use: Yes  . Drug use: No  . Sexual activity: Not on file  Lifestyle  . Physical activity:    Days per week: Not on file    Minutes per session: Not on file  . Stress: Not on file  Relationships  .  Social connections:    Talks on phone: Not on file    Gets together: Not on file    Attends religious service: Not on file    Active member of club or organization: Not on file    Attends meetings of clubs or organizations: Not on file    Relationship status: Not on file  Other Topics Concern  . Not on file  Social History Narrative  . Not on file    Vitals:   11/10/17 1543  BP: 110/70  Pulse: 73  Resp: 12  Temp: 98.5 F (36.9 C)  SpO2: 96%   Body mass index is 28.29 kg/m.   Physical Exam  Nursing note and vitals  reviewed. Constitutional: He is oriented to person, place, and time. He appears well-developed and well-nourished. No distress.  HENT:  Head: Normocephalic and atraumatic.  Mouth/Throat: Oropharynx is clear and moist and mucous membranes are normal.  Eyes: Pupils are equal, round, and reactive to light. Conjunctivae are normal.  Cardiovascular: Normal rate and regular rhythm.  No murmur heard. Respiratory: Effort normal and breath sounds normal. No respiratory distress.  Musculoskeletal: He exhibits no edema.  Lymphadenopathy:    He has no cervical adenopathy.  Neurological: He is alert and oriented to person, place, and time.  Skin: Skin is warm. No erythema.  Psychiatric: His mood appears anxious. Cognition and memory are normal. He expresses no suicidal ideation. He expresses no suicidal plans.  Well groomed, good eye contact.      ASSESSMENT AND PLAN:   Tyler Chambers was seen today for anxiety.  Diagnoses and all orders for this visit:  GAD (generalized anxiety disorder) After discussion of pharmacologic treatment options as well as side effects, he agrees with trying sertraline. He will start sertraline 25 mg (1/2 tablet) and instructed to increase dose to whole tablet (50 mg) in 2 weeks if well-tolerated. He will continue psychotherapy every 2 weeks. Instructed about warning signs. Follow-up in 6 weeks, before if needed.   Needs flu shot -     Flu Vaccine QUAD 6+ mos PF IM (Fluarix Quad PF)    Return in about 6 weeks (around 12/22/2017) for anxiety.     Jaggar Benko G. [place], MD  [facility]. [place] office.

## 2017-11-10 NOTE — Assessment & Plan Note (Signed)
After discussion of pharmacologic treatment options as well as side effects, he agrees with trying sertraline. He will start sertraline 25 mg (1/2 tablet) and instructed to increase dose to whole tablet (50 mg) in 2 weeks if well-tolerated. He will continue psychotherapy every 2 weeks. Instructed about warning signs. Follow-up in 6 weeks, before if needed.

## 2017-11-24 ENCOUNTER — Ambulatory Visit: Payer: 59 | Admitting: Psychology

## 2017-12-08 ENCOUNTER — Ambulatory Visit: Payer: Self-pay | Admitting: Psychology

## 2017-12-22 ENCOUNTER — Other Ambulatory Visit: Payer: Self-pay | Admitting: Family Medicine

## 2017-12-22 ENCOUNTER — Ambulatory Visit (INDEPENDENT_AMBULATORY_CARE_PROVIDER_SITE_OTHER): Payer: 59 | Admitting: Psychology

## 2017-12-22 DIAGNOSIS — F411 Generalized anxiety disorder: Secondary | ICD-10-CM

## 2017-12-22 DIAGNOSIS — F418 Other specified anxiety disorders: Secondary | ICD-10-CM

## 2018-01-19 ENCOUNTER — Ambulatory Visit (INDEPENDENT_AMBULATORY_CARE_PROVIDER_SITE_OTHER): Payer: 59 | Admitting: Psychology

## 2018-01-19 DIAGNOSIS — F418 Other specified anxiety disorders: Secondary | ICD-10-CM | POA: Diagnosis not present

## 2018-02-02 ENCOUNTER — Ambulatory Visit: Payer: 59 | Admitting: Psychology

## 2018-02-11 ENCOUNTER — Ambulatory Visit: Payer: 59 | Admitting: Psychology

## 2018-02-25 ENCOUNTER — Ambulatory Visit: Payer: Self-pay | Admitting: Psychology

## 2018-03-06 ENCOUNTER — Other Ambulatory Visit: Payer: Self-pay | Admitting: Family Medicine

## 2018-03-06 DIAGNOSIS — F411 Generalized anxiety disorder: Secondary | ICD-10-CM

## 2018-03-11 ENCOUNTER — Ambulatory Visit: Payer: Self-pay | Admitting: Psychology

## 2018-03-15 ENCOUNTER — Encounter: Payer: Self-pay | Admitting: Family Medicine

## 2018-03-15 ENCOUNTER — Other Ambulatory Visit: Payer: Self-pay | Admitting: Family Medicine

## 2018-03-15 ENCOUNTER — Ambulatory Visit (INDEPENDENT_AMBULATORY_CARE_PROVIDER_SITE_OTHER): Payer: Managed Care, Other (non HMO) | Admitting: Family Medicine

## 2018-03-15 VITALS — BP 112/72 | HR 74 | Temp 98.6°F | Resp 12 | Ht 73.0 in | Wt 210.4 lb

## 2018-03-15 DIAGNOSIS — L219 Seborrheic dermatitis, unspecified: Secondary | ICD-10-CM | POA: Diagnosis not present

## 2018-03-15 DIAGNOSIS — F411 Generalized anxiety disorder: Secondary | ICD-10-CM

## 2018-03-15 DIAGNOSIS — R21 Rash and other nonspecific skin eruption: Secondary | ICD-10-CM | POA: Diagnosis not present

## 2018-03-15 MED ORDER — SERTRALINE HCL 50 MG PO TABS: 50.0000 mg | ORAL_TABLET | Freq: Every day | ORAL | 2 refills | Status: DC

## 2018-03-15 MED ORDER — CLOTRIMAZOLE-BETAMETHASONE 1-0.05 % EX CREA: 1.0000 "application " | TOPICAL_CREAM | Freq: Two times a day (BID) | CUTANEOUS | 1 refills | Status: AC

## 2018-03-15 NOTE — Assessment & Plan Note (Signed)
Pruritic lesion on chest seems to be well controlled with topical treatment. No changes in current management. Some side effects of topical steroid discussed.

## 2018-03-15 NOTE — Assessment & Plan Note (Signed)
Reporting great improvement. No changes in Zocor 50 mg daily. He prefers to continue following annually, which I think is appropriate. Clearly instructed about warning signs.

## 2018-03-15 NOTE — Patient Instructions (Signed)
A few things to remember from today's visit:   Facial rash  Seborrheic dermatitis - Plan: clotrimazole-betamethasone (LOTRISONE) cream  GAD (generalized anxiety disorder) - Plan: sertraline (ZOLOFT) 50 MG tablet  You can apply topical steroid your face but mix it with daily moisturizer. Given the fact anxiety is well controlled I think it is appropriate to follow annually during your physicals.   Please be sure medication list is accurate. If a new problem present, please set up appointment sooner than planned today.

## 2018-03-15 NOTE — Progress Notes (Signed)
HPI:   Tyler Chambers is a 43 y.o. male, who is here today for follow up.   He was last seen on 11/10/17. Last visit he was c/o anxiety. Sertraline 50 mg daily. He has not noted side effects and medication has helped.  Denies depressed mood or suicidal thoughts.  Depression screen [facility] 2/9 03/15/2018 06/16/2017  Decreased Interest 0 0  Down, Depressed, Hopeless 1 0  PHQ - 2 Score 1 0  Altered sleeping 1 -  Tired, decreased energy 0 -  Change in appetite 0 -  Feeling bad or failure about yourself  1 -  Moving slowly or fidgety/restless 0 -  Suicidal thoughts 0 -  PHQ-9 Score 3 -  Difficult doing work/chores Not difficult at all -      Today he is complaining about mildly pruritic erythematous rash around eyes that he noted a couple months ago. He has history of pruritic rash on his chest, diagnosed with psoriatic arthritis and currently on Lotrisone.  This lesion goes away completely after treated with topical steroid and he can be months before rash comes back. He is also using topical steroids on facial rash, it helps.  Topical moisturizer also seems to help. He has not had it in the past exacerbated factors.   Review of Systems  Constitutional: Negative for activity change, appetite change, fatigue and fever.  HENT: Negative for trouble swallowing.   Eyes: Negative for visual disturbance.  Respiratory: Negative for cough, shortness of breath and wheezing.   Cardiovascular: Negative for chest pain, palpitations and leg swelling.  Gastrointestinal: Negative for abdominal pain, diarrhea, nausea and vomiting.  Skin: Positive for rash.  Neurological: Negative for tremors, weakness and headaches.  Psychiatric/Behavioral: Negative for hallucinations, sleep disturbance and suicidal ideas. The patient is not nervous/anxious.      Current Outpatient Medications on File Prior to Visit  Medication Sig Dispense Refill  . albuterol (PROVENTIL HFA;VENTOLIN HFA) 108 (90  Base) MCG/ACT inhaler Inhale 2 puffs into the lungs every 6 (six) hours as needed for wheezing or shortness of breath. 1 Inhaler 3  . ketoconazole (NIZORAL) 2 % shampoo Apply 1 application topically 2 (two) times a week. 120 mL 1   No current facility-administered medications on file prior to visit.      Past Medical History:  Diagnosis Date  . Allergy   . Asthma   . Back pain    No Known Allergies  Social History   Socioeconomic History  . Marital status: Married    Spouse name: Not on file  . Number of children: Not on file  . Years of education: Not on file  . Highest education level: Not on file  Occupational History  . Not on file  Social Needs  . Financial resource strain: Not on file  . Food insecurity:    Worry: Not on file    Inability: Not on file  . Transportation needs:    Medical: Not on file    Non-medical: Not on file  Tobacco Use  . Smoking status: Never Smoker  . Smokeless tobacco: Never Used  Substance and Sexual Activity  . Alcohol use: Yes  . Drug use: No  . Sexual activity: Not on file  Lifestyle  . Physical activity:    Days per week: Not on file    Minutes per session: Not on file  . Stress: Not on file  Relationships  . Social connections:    Talks on phone: Not  on file    Gets together: Not on file    Attends religious service: Not on file    Active member of club or organization: Not on file    Attends meetings of clubs or organizations: Not on file    Relationship status: Not on file  Other Topics Concern  . Not on file  Social History Narrative  . Not on file    Vitals:   03/15/18 1537  BP: 112/72  Pulse: 74  Resp: 12  Temp: 98.6 F (37 C)  SpO2: 97%   Body mass index is 27.76 kg/m.      Physical Exam  Constitutional: He is oriented to person, place, and time. He appears well-developed. No distress.  HENT:  Head: Atraumatic.  Mouth/Throat: Oropharynx is clear and moist and mucous membranes are normal.  Eyes:  Conjunctivae and EOM are normal.  Cardiovascular: Normal rate and regular rhythm.  No murmur heard. Respiratory: Effort normal and breath sounds normal. No respiratory distress.  GI: Soft. He exhibits no mass. There is no hepatomegaly. There is no abdominal tenderness.  Musculoskeletal:        General: No edema.  Neurological: He is alert and oriented to person, place, and time.  Skin: Skin is warm. Rash noted. Rash is maculopapular. No erythema.     Psychiatric: He has a normal mood and affect. Cognition and memory are normal. He expresses no suicidal ideation.     ASSESSMENT AND PLAN:   [name] was seen today for follow-up.  No orders of the defined types were placed in this encounter.   GAD (generalized anxiety disorder) Reporting great improvement. No changes in Zocor 50 mg daily. He prefers to continue following annually, which I think is appropriate. Clearly instructed about warning signs.  Seborrheic dermatitis Pruritic lesion on chest seems to be well controlled with topical treatment. No changes in current management. Some side effects of topical steroid discussed.    Facial rash Rash on face could also be related to seborrheic dermatitis. We discussed other possible etiologies, and dry skin. Recommend continuing Lotrisone cream but mixing with daily moisturizer. He understand side effects of chronic use of topical steroid, mainly on the face.   Return in about 1 year (around 03/15/2019) for cpe.      Jadyn Barge G. [place], MD  [facility]. [place] office.

## 2018-03-23 ENCOUNTER — Ambulatory Visit: Payer: Self-pay | Admitting: Family Medicine

## 2018-03-25 ENCOUNTER — Ambulatory Visit: Payer: 59 | Admitting: Psychology

## 2018-04-08 ENCOUNTER — Ambulatory Visit: Payer: 59 | Admitting: Psychology

## 2018-04-22 ENCOUNTER — Ambulatory Visit: Payer: 59 | Admitting: Psychology

## 2018-05-06 ENCOUNTER — Ambulatory Visit: Payer: 59 | Admitting: Psychology

## 2018-09-04 IMAGING — DX DG LUMBAR SPINE COMPLETE 4+V
5 series · 5 of 5 positions shown · non-contrast
Comparison: None.

CLINICAL DATA: Chronic bilateral lumbosacral back pain. No known
injury.

EXAM:
LUMBAR SPINE - COMPLETE 4+ VIEW

[l-spine ap]
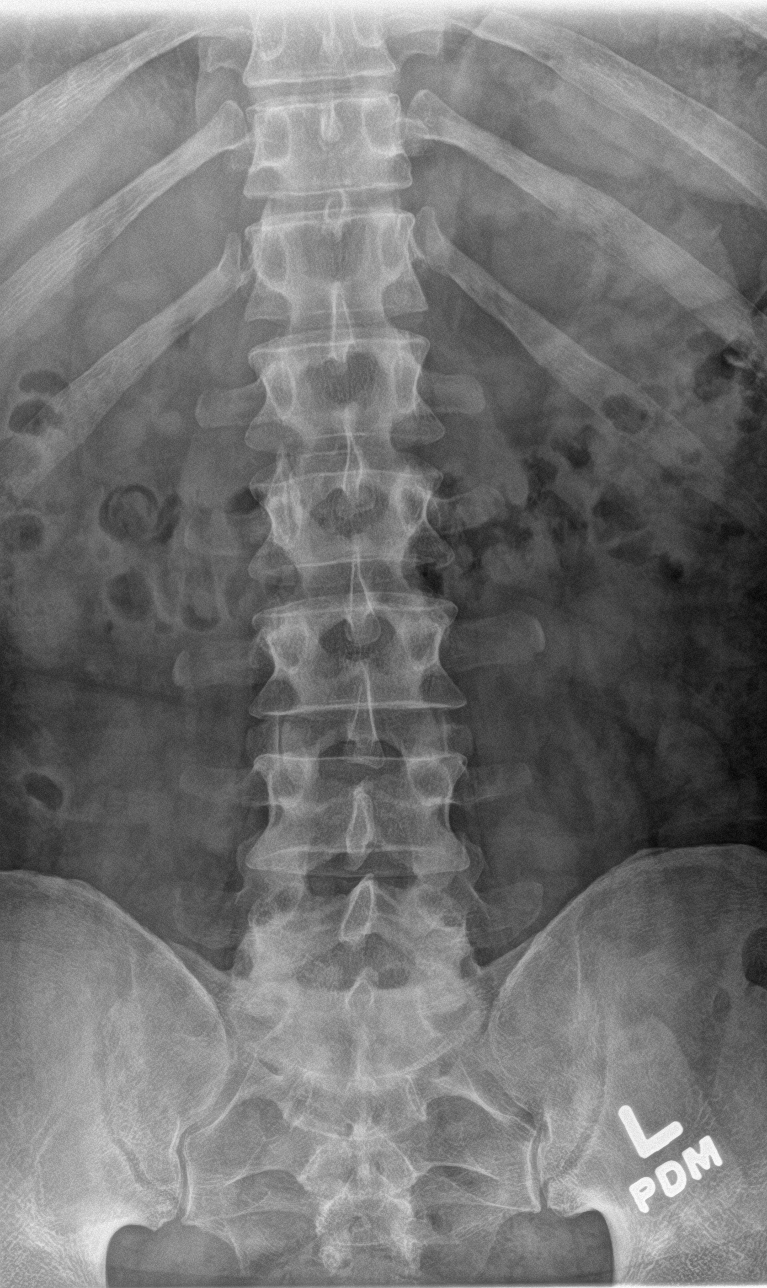

[l-spine obl (1 of 2)]
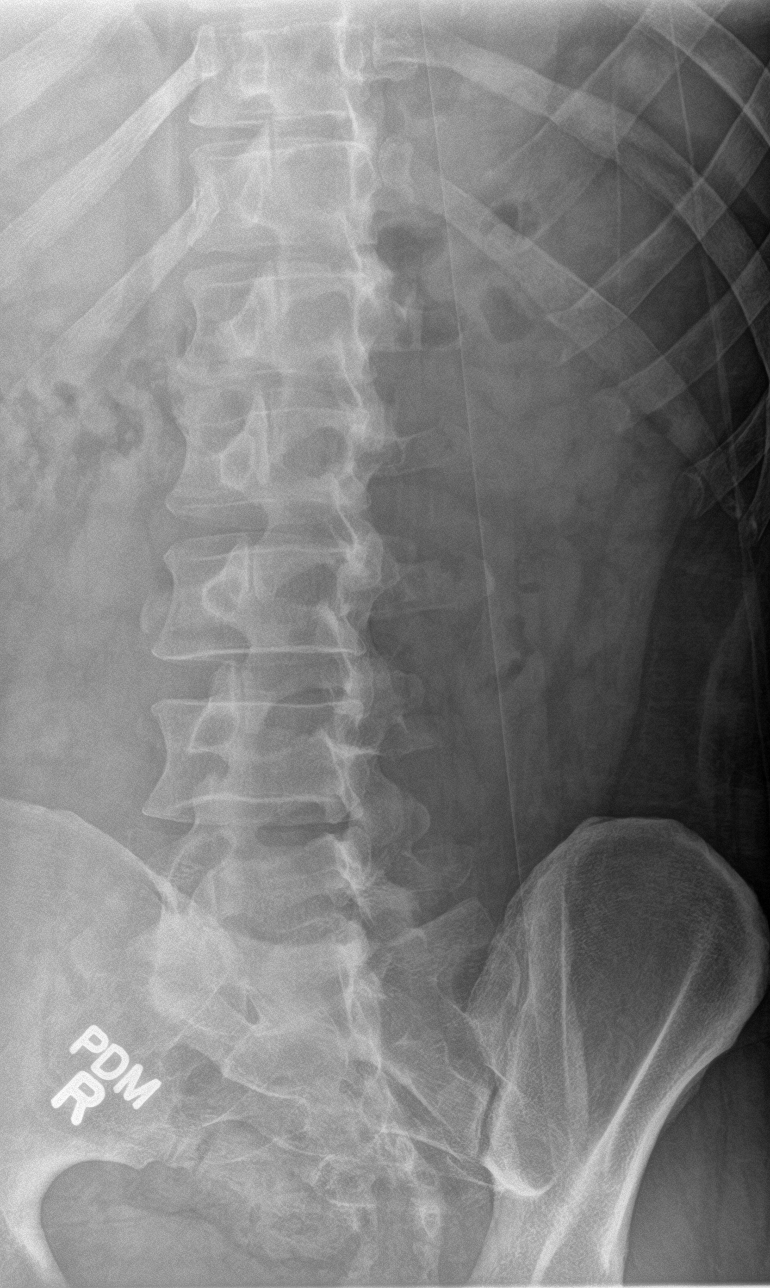

[l-spine obl (2 of 2)]
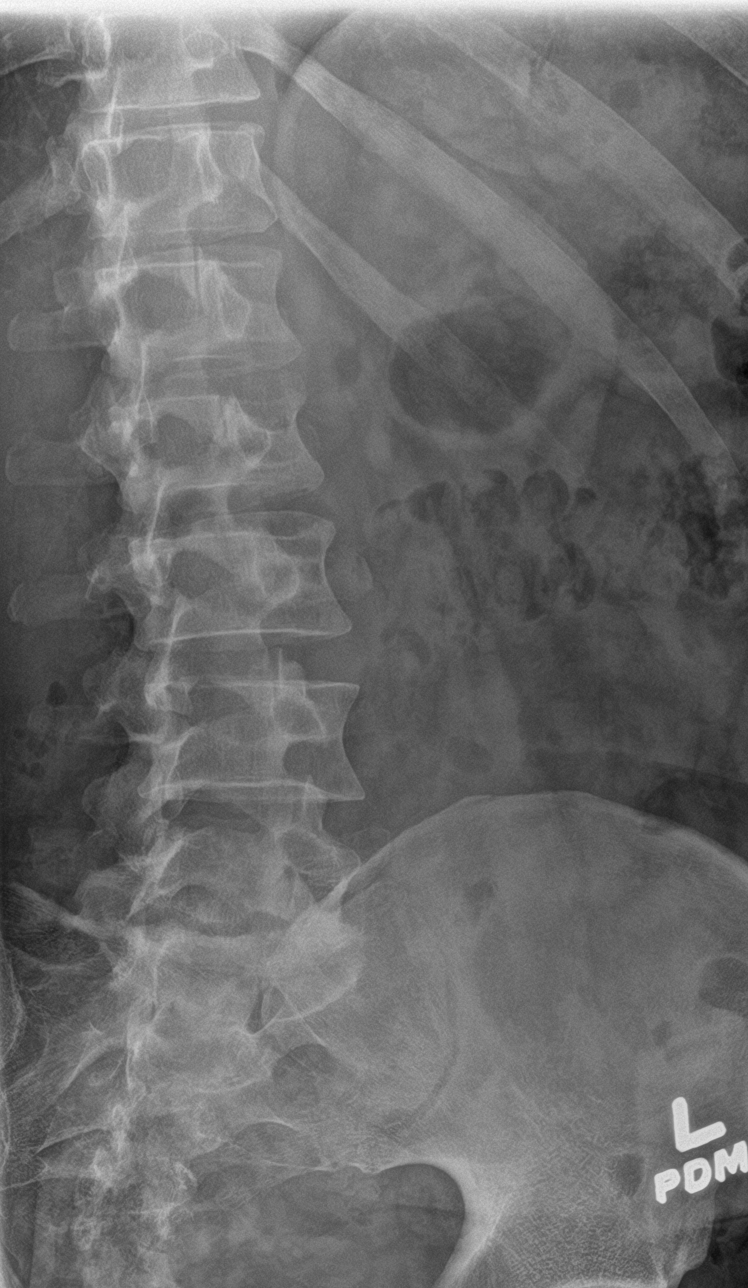

[l-spine lat]
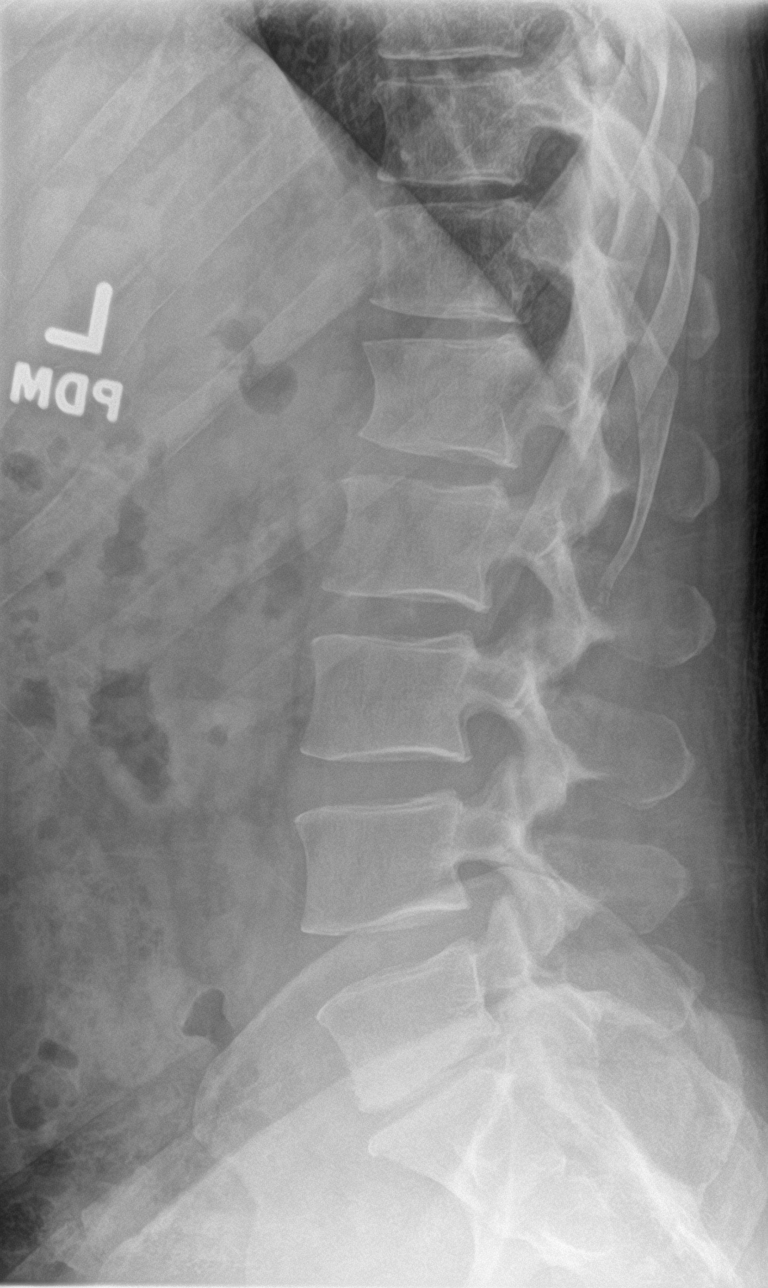

[l-spine spot]
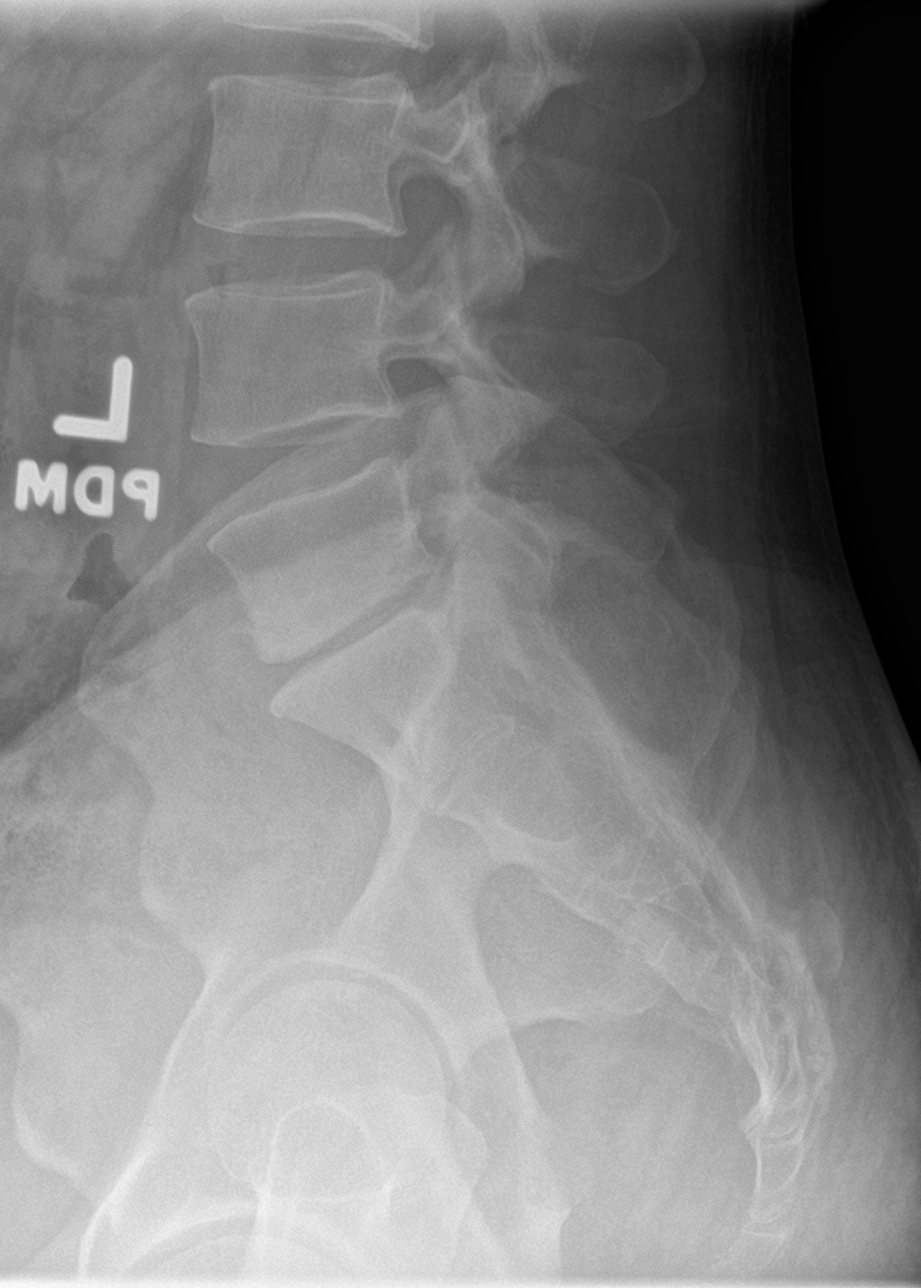

[5 of 5 positions shown; findings below may reference images not displayed]

FINDINGS: The alignment is maintained. Vertebral body heights are normal.
There is no listhesis. The posterior elements are intact. There is
disc space narrowing at L5-S1. Minimal disc space narrowing at
L1-L2. No fracture or evidence pars defects. Sacroiliac joints are
symmetric and normal.
IMPRESSION: Degenerative disc disease with disc space narrowing at L5-S1 and to
a lesser extent L1-L2.

## 2019-01-04 ENCOUNTER — Other Ambulatory Visit: Payer: Self-pay | Admitting: Family Medicine

## 2019-01-04 DIAGNOSIS — F411 Generalized anxiety disorder: Secondary | ICD-10-CM

## 2019-09-13 ENCOUNTER — Other Ambulatory Visit: Payer: Self-pay | Admitting: Family Medicine

## 2019-09-13 DIAGNOSIS — F411 Generalized anxiety disorder: Secondary | ICD-10-CM

## 2019-12-25 ENCOUNTER — Other Ambulatory Visit: Payer: Self-pay

## 2019-12-25 ENCOUNTER — Encounter: Payer: Self-pay | Admitting: Family Medicine

## 2019-12-25 ENCOUNTER — Ambulatory Visit (INDEPENDENT_AMBULATORY_CARE_PROVIDER_SITE_OTHER): Payer: PRIVATE HEALTH INSURANCE | Admitting: Family Medicine

## 2019-12-25 VITALS — BP 116/78 | HR 78 | Temp 98.6°F | Resp 16 | Ht 73.0 in | Wt 221.4 lb

## 2019-12-25 DIAGNOSIS — S93402A Sprain of unspecified ligament of left ankle, initial encounter: Secondary | ICD-10-CM

## 2019-12-25 DIAGNOSIS — S93492A Sprain of other ligament of left ankle, initial encounter: Secondary | ICD-10-CM

## 2019-12-25 DIAGNOSIS — F411 Generalized anxiety disorder: Secondary | ICD-10-CM

## 2019-12-25 MED ORDER — SERTRALINE HCL 25 MG PO TABS: 25.0000 mg | ORAL_TABLET | Freq: Every day | ORAL | 1 refills | Status: DC

## 2019-12-25 NOTE — Patient Instructions (Addendum)
A few things to remember from today's visit:   GAD (generalized anxiety disorder) - Plan: sertraline (ZOLOFT) 25 MG tablet  Sprain of anterior talofibular ligament of left ankle, initial encounter  Sertraline 25 mg for 3 weeks, then 12.5 mg for 3 weeks, then every other day x 2 weeks then every 3rd day for a week.  Please be sure medication list is accurate. If a new problem present, please set up appointment sooner than planned today.   Ankle Sprain  An ankle sprain is a stretch or tear in one of the tough tissues (ligaments) that connect the bones in your ankle. An ankle sprain can happen when the ankle rolls outward (inversion sprain) or inward (eversion sprain). What are the causes? This condition is caused by rolling or twisting the ankle. What increases the risk? You are more likely to develop this condition if you play sports. What are the signs or symptoms? Symptoms of this condition include:  Pain in your ankle.  Swelling.  Bruising. This may happen right after you sprain your ankle or 1-2 days later.  Trouble standing or walking. How is this diagnosed? This condition is diagnosed with:  A physical exam. During the exam, your doctor will press on certain parts of your foot and ankle and try to move them in certain ways.  X-ray imaging. These may be taken to see how bad the sprain is and to check for broken bones. How is this treated? This condition may be treated with:  A brace or splint. This is used to keep the ankle from moving until it heals.  An elastic bandage. This is used to support the ankle.  Crutches.  Pain medicine.  Surgery. This may be needed if the sprain is very bad.  Physical therapy. This may help to improve movement in the ankle. Follow these instructions at home: If you have a brace or a splint:  Wear the brace or splint as told by your doctor. Remove it only as told by your doctor.  Loosen the brace or splint if your  toes: ? Tingle. ? Lose feeling (become numb). ? Turn cold and blue.  Keep the brace or splint clean.  If the brace or splint is not waterproof: ? Do not let it get wet. ? Cover it with a watertight covering when you take a bath or a shower. If you have an elastic bandage (dressing):  Remove it to shower or bathe.  Try not to move your ankle much, but wiggle your toes from time to time. This helps to prevent swelling.  Adjust the dressing if it feels too tight.  Loosen the dressing if your foot: ? Loses feeling. ? Tingles. ? Becomes cold and blue. Managing pain, stiffness, and swelling   Take over-the-counter and prescription medicines only as told by doctor.  For 2-3 days, keep your ankle raised (elevated) above the level of your heart.  If told, put ice on the injured area: ? If you have a removable brace or splint, remove it as told by your doctor. ? Put ice in a plastic bag. ? Place a towel between your skin and the bag. ? Leave the ice on for 20 minutes, 2-3 times a day. General instructions  Rest your ankle.  Do not use your injured leg to support your body weight until your doctor says that you can. Use crutches as told by your doctor.  Do not use any products that contain nicotine or tobacco, such as cigarettes, e-cigarettes,  and chewing tobacco. If you need help quitting, ask your doctor.  Keep all follow-up visits as told by your doctor. Contact a doctor if:  Your bruises or swelling are quickly getting worse.  Your pain does not get better after you take medicine. Get help right away if:  You cannot feel your toes or foot.  Your foot or toes look blue.  You have very bad pain that gets worse. Summary  An ankle sprain is a stretch or tear in one of the tough tissues (ligaments) that connect the bones in your ankle.  This condition is caused by rolling or twisting the ankle.  Symptoms include pain, swelling, bruising, and trouble walking.  To  help with pain and swelling, put ice on the injured ankle, raise your ankle above the level of your heart, and use an elastic bandage. Also, rest as told by your doctor.  Keep all follow-up visits as told by your doctor. This is important. This information is not intended to replace advice given to you by your health care provider. Make sure you discuss any questions you have with your health care provider. Document Revised: 05/25/2017 Document Reviewed: 05/25/2017 Elsevier Patient Education  2020 [employer].

## 2019-12-25 NOTE — Progress Notes (Signed)
Chief Complaint  Patient presents with  . medication follow up  . Shoulder Pain    HPI: TylerTyler Chambers is a 44 y.o. male, who is here today for follow up.  Tyler Chambers was last seen on 03/15/18.  Anxiety: Tyler Chambers is on Sertraline 50 mg daily.  Medication started in 10/2017, work related stress. Tyler Chambers has tolerated medication well. Conditions at work have improved, Tyler Chambers thinks Tyler Chambers can stop medication. Negative for depressed mood.  Shoulder pain this past weekend after water body surfing. Strained shoulder, sharp pain. Pain has resolved after rest. Negative for numbness,tingling,or limitation of ROM.  Yesterday Tyler Chambers twisted his left ankle while hiking,Tyler Chambers heard a "pop." Mild edema. Tyler Chambers was able to finish hiking by in pain.  Negative for ecchymosis or erythema. No limitation of ROM but movement exacerbates pain. Tyler Chambers has tried epson salt, which has helped.  Review of Systems  Constitutional: Negative for chills, fatigue and fever.  Respiratory: Negative for chest tightness and shortness of breath.   Cardiovascular: Negative for chest pain and palpitations.  Gastrointestinal: Negative for abdominal pain, nausea and vomiting.  Skin: Negative for rash and wound.  Neurological: Negative for syncope and weakness.  Psychiatric/Behavioral: Negative for behavioral problems and confusion.  Rest of ROS, see pertinent positives sand negatives in HPI  Current Outpatient Medications on File Prior to Visit  Medication Sig Dispense Refill  . albuterol (PROVENTIL HFA;VENTOLIN HFA) 108 (90 Base) MCG/ACT inhaler Inhale 2 puffs into the lungs every 6 (six) hours as needed for wheezing or shortness of breath. 1 Inhaler 3  . clotrimazole-betamethasone (LOTRISONE) cream Apply 1 application topically 2 (two) times daily. 45 g 1  . ketoconazole (NIZORAL) 2 % shampoo Apply 1 application topically 2 (two) times a week. 120 mL 1   No current facility-administered medications on file prior to visit.   Past  Medical History:  Diagnosis Date  . Allergy   . Asthma   . Back pain    No Known Allergies  Social History   Socioeconomic History  . Marital status: Married    Spouse name: Not on file  . Number of children: Not on file  . Years of education: Not on file  . Highest education level: Not on file  Occupational History  . Not on file  Tobacco Use  . Smoking status: Never Smoker  . Smokeless tobacco: Never Used  Substance and Sexual Activity  . Alcohol use: Yes  . Drug use: No  . Sexual activity: Not on file  Other Topics Concern  . Not on file  Social History Narrative  . Not on file   Social Determinants of Health   Financial Resource Strain: Not on file  Food Insecurity: Not on file  Transportation Needs: Not on file  Physical Activity: Not on file  Stress: Not on file  Social Connections: Not on file   Vitals:   12/25/19 1408  BP: 116/78  Pulse: 78  Resp: 16  Temp: 98.6 F (37 C)  SpO2: 98%   Body mass index is 29.21 kg/m.  Physical Exam Vitals and nursing note reviewed.  Constitutional:      General: Tyler Chambers is not in acute distress.    Appearance: Tyler Chambers is well-developed. Tyler Chambers is not ill-appearing.  HENT:     Head: Normocephalic and atraumatic.  Eyes:     Conjunctiva/sclera: Conjunctivae normal.  Cardiovascular:     Rate and Rhythm: Normal rate and regular rhythm.  Pulmonary:     Effort: Pulmonary  effort is normal. No respiratory distress.  Musculoskeletal:        General: No edema.     Left shoulder: No swelling, deformity, tenderness or bony tenderness. Normal range of motion.     Left ankle: Swelling present. No deformity or ecchymosis. Tenderness present over the lateral malleolus, ATF ligament and CF ligament. No proximal fibula tenderness. Normal range of motion. Anterior drawer test negative. Normal pulse.     Comments: There is no pain of malleolus with vibration.   Skin:    General: Skin is warm.     Findings: No erythema or rash.   Neurological:     General: No focal deficit present.     Mental Status: Tyler Chambers is alert and oriented to person, place, and time.     Deep Tendon Reflexes: Strength normal.  Psychiatric:        Mood and Affect: Mood and affect normal. Mood is not anxious or depressed.   ASSESSMENT AND PLAN:  Tyler Chambers was seen today for follow-up.  Diagnoses and all orders for this visit:  Sprain of left ankle, unspecified ligament, initial encounter Mild. I do not think imaging is needed but offered,Tyler Chambers agrees on waiting for now. Local ice for 48-72 hours. Ibuprofen 400 mg tid with food. ABC exercises a few times during the day. Gel brace placed to keep for 7 days then prn.  GAD (generalized anxiety disorder) Improved. Tyler Chambers will continue weaning off medication as instructed. Monitor for withdrawal symptoms.  -     sertraline (ZOLOFT) 25 MG tablet; Take 1 tablet (25 mg total) by mouth daily.  Return in about 3 months (around 04/02/2020), or if symptoms worsen or fail to improve, for cpe.  Tyler Pelley G. [place], MD  [facility] [place] office.   A few things to remember from today's visit:   GAD (generalized anxiety disorder) - Plan: sertraline (ZOLOFT) 25 MG tablet  Sprain of anterior talofibular ligament of left ankle, initial encounter  Sertraline 25 mg for 3 weeks, then 12.5 mg for 3 weeks, then every other day x 2 weeks then every 3rd day for a week.  Please be sure medication list is accurate. If a new problem present, please set up appointment sooner than planned today.   Ankle Sprain  An ankle sprain is a stretch or tear in one of the tough tissues (ligaments) that connect the bones in your ankle. An ankle sprain can happen when the ankle rolls outward (inversion sprain) or inward (eversion sprain). What are the causes? This condition is caused by rolling or twisting the ankle. What increases the risk? You are more likely to develop this condition if you play  sports. What are the signs or symptoms? Symptoms of this condition include:  Pain in your ankle.  Swelling.  Bruising. This may happen right after you sprain your ankle or 1-2 days later.  Trouble standing or walking. How is this diagnosed? This condition is diagnosed with:  A physical exam. During the exam, your doctor will press on certain parts of your foot and ankle and try to move them in certain ways.  X-ray imaging. These may be taken to see how bad the sprain is and to check for broken bones. How is this treated? This condition may be treated with:  A brace or splint. This is used to keep the ankle from moving until it heals.  An elastic bandage. This is used to support the ankle.  Crutches.  Pain medicine.  Surgery. This may be needed if the sprain is very bad.  Physical therapy. This may help to improve movement in the ankle. Follow these instructions at home: If you have a brace or a splint:  Wear the brace or splint as told by your doctor. Remove it only as told by your doctor.  Loosen the brace or splint if your toes: ? Tingle. ? Lose feeling (become numb). ? Turn cold and blue.  Keep the brace or splint clean.  If the brace or splint is not waterproof: ? Do not let it get wet. ? Cover it with a watertight covering when you take a bath or a shower. If you have an elastic bandage (dressing):  Remove it to shower or bathe.  Try not to move your ankle much, but wiggle your toes from time to time. This helps to prevent swelling.  Adjust the dressing if it feels too tight.  Loosen the dressing if your foot: ? Loses feeling. ? Tingles. ? Becomes cold and blue. Managing pain, stiffness, and swelling   Take over-the-counter and prescription medicines only as told by doctor.  For 2-3 days, keep your ankle raised (elevated) above the level of your heart.  If told, put ice on the injured area: ? If you have a removable brace or splint, remove it as  told by your doctor. ? Put ice in a plastic bag. ? Place a towel between your skin and the bag. ? Leave the ice on for 20 minutes, 2-3 times a day. General instructions  Rest your ankle.  Do not use your injured leg to support your body weight until your doctor says that you can. Use crutches as told by your doctor.  Do not use any products that contain nicotine or tobacco, such as cigarettes, e-cigarettes, and chewing tobacco. If you need help quitting, ask your doctor.  Keep all follow-up visits as told by your doctor. Contact a doctor if:  Your bruises or swelling are quickly getting worse.  Your pain does not get better after you take medicine. Get help right away if:  You cannot feel your toes or foot.  Your foot or toes look blue.  You have very bad pain that gets worse. Summary  An ankle sprain is a stretch or tear in one of the tough tissues (ligaments) that connect the bones in your ankle.  This condition is caused by rolling or twisting the ankle.  Symptoms include pain, swelling, bruising, and trouble walking.  To help with pain and swelling, put ice on the injured ankle, raise your ankle above the level of your heart, and use an elastic bandage. Also, rest as told by your doctor.  Keep all follow-up visits as told by your doctor. This is important. This information is not intended to replace advice given to you by your health care provider. Make sure you discuss any questions you have with your health care provider. Document Revised: 05/25/2017 Document Reviewed: 05/25/2017 Elsevier Patient Education  2020 [employer].

## 2020-03-11 ENCOUNTER — Other Ambulatory Visit: Payer: Self-pay | Admitting: Family Medicine

## 2020-03-11 DIAGNOSIS — F411 Generalized anxiety disorder: Secondary | ICD-10-CM

## 2021-03-26 NOTE — Progress Notes (Signed)
? ?HPI: ?[name] is a 46 y.o.male here today for his routine physical examination. ? ?Last CPE: 06/16/2017 ?No new problems since his last visit. ? ?Regular exercise: Walking 3 miles daily and stretching exercises. ?Following a healthy diet: Cooking at home, soups and vegetables, eating more out lately due to stress. ? ?Chronic medical problems: Anxiety and asthma among some. ? ?Immunization History  ?Administered Date(s) Administered  ? Influenza-Unspecified 10/20/2016  ? MMR 10/20/2017  ? Tdap 03/28/2021  ? ?Health Maintenance  ?Topic Date Due  ? Hepatitis C Screening  Never done  ? COLONOSCOPY (Pts 45-56yr Insurance coverage will need to be confirmed)  Never done  ? INFLUENZA VACCINE  04/11/2021 (Originally 08/12/2020)  ? HIV Screening  03/28/2032 (Originally 03/29/1989)  ? TETANUS/TDAP  03/29/2031  ? HPV VACCINES  Aged Out  ? ?-Negative for high alcohol intake or tobacco use. ? ?Concerns and/or follow up today:  ? ?He has been under a lot of stress, work related.  ?He is concerned about CV risk factors. ?+ FHx of CAD, father MI at age 47 Also his PGF.  ?Reporting some chest discomfort, most of the time. No exertional CP. Attributed to "emotional stress." He has not had any CP,palpitation,or SOB while exercising. ?Hx of sinus bradycardia, HR in the low 60's in average. ? ?Review of Systems  ?Constitutional:  Negative for activity change, appetite change and fever.  ?HENT:  Negative for mouth sores, nosebleeds and sore throat.   ?Eyes:  Negative for redness and visual disturbance.  ?Respiratory:  Negative for cough, shortness of breath and wheezing.   ?Cardiovascular:  Negative for chest pain, palpitations and leg swelling.  ?Gastrointestinal:  Negative for abdominal pain, blood in stool, nausea and vomiting.  ?Endocrine: Negative for cold intolerance, heat intolerance, polydipsia, polyphagia and polyuria.  ?Genitourinary:  Negative for decreased urine volume, dysuria, genital sores, hematuria and  testicular pain.  ?Musculoskeletal:  Negative for gait problem and myalgias.  ?Skin:  Negative for color change and rash.  ?Allergic/Immunologic: Positive for environmental allergies.  ?Neurological:  Negative for seizures, syncope, weakness, numbness and headaches.  ?Hematological:  Negative for adenopathy. Does not bruise/bleed easily.  ?Psychiatric/Behavioral:  Negative for confusion. The patient is nervous/anxious.   ? ?Current Outpatient Medications on File Prior to Visit  ?Medication Sig Dispense Refill  ? albuterol (PROVENTIL HFA;VENTOLIN HFA) 108 (90 Base) MCG/ACT inhaler Inhale 2 puffs into the lungs every 6 (six) hours as needed for wheezing or shortness of breath. 1 Inhaler 3  ? clotrimazole-betamethasone (LOTRISONE) cream Apply 1 application topically 2 (two) times daily. 45 g 1  ? ketoconazole (NIZORAL) 2 % shampoo Apply 1 application topically 2 (two) times a week. 120 mL 1  ? sertraline (ZOLOFT) 25 MG tablet TAKE 1 TABLET (25 MG TOTAL) BY MOUTH DAILY. 30 tablet 1  ? ?No current facility-administered medications on file prior to visit.  ? ?Past Medical History:  ?Diagnosis Date  ? Allergy   ? Asthma   ? Back pain   ? ?Past Surgical History:  ?Procedure Laterality Date  ? TONSILLECTOMY    ? ?No Known Allergies ? ?Family History  ?Problem Relation Age of Onset  ? Heart disease Father   ?     CAD  ? Cancer Maternal Grandfather   ?     lung  ? Heart disease Paternal Grandfather   ?     CAD  ? Diabetes Paternal Grandfather   ? ? ?Social History  ? ?Socioeconomic History  ?  Marital status: Married  ?  Spouse name: Not on file  ? Number of children: Not on file  ? Years of education: Not on file  ? Highest education level: Not on file  ?Occupational History  ? Not on file  ?Tobacco Use  ? Smoking status: Never  ? Smokeless tobacco: Never  ?Substance and Sexual Activity  ? Alcohol use: Yes  ? Drug use: No  ? Sexual activity: Not on file  ?Other Topics Concern  ? Not on file  ?Social History Narrative  ? Not on  file  ? ?Social Determinants of Health  ? ?Financial Resource Strain: Not on file  ?Food Insecurity: Not on file  ?Transportation Needs: Not on file  ?Physical Activity: Not on file  ?Stress: Not on file  ?Social Connections: Not on file  ? ?Vitals:  ? 03/28/21 1014  ?BP: 128/80  ?Pulse: 63  ?Resp: 12  ?SpO2: 98%  ? ?Body mass index is 28.4 kg/m?. ? ?Wt Readings from Last 3 Encounters:  ?03/28/21 215 lb 4 oz (97.6 kg)  ?12/25/19 221 lb 6.4 oz (100.4 kg)  ?03/15/18 210 lb 6 oz (95.4 kg)  ? ?Physical Exam ?Vitals and nursing note reviewed.  ?Constitutional:   ?   General: He is not in acute distress. ?   Appearance: He is well-developed.  ?HENT:  ?   Head: Normocephalic and atraumatic.  ?   Right Ear: Tympanic membrane, ear canal and external ear normal.  ?   Left Ear: Tympanic membrane, ear canal and external ear normal.  ?   Mouth/Throat:  ?   Mouth: Mucous membranes are moist.  ?   Pharynx: Oropharynx is clear.  ?Eyes:  ?   Extraocular Movements: Extraocular movements intact.  ?   Conjunctiva/sclera: Conjunctivae normal.  ?   Pupils: Pupils are equal, round, and reactive to light.  ?Neck:  ?   Thyroid: No thyromegaly.  ?   Trachea: No tracheal deviation.  ?Cardiovascular:  ?   Rate and Rhythm: Normal rate and regular rhythm.  ?   Pulses:     ?     Dorsalis pedis pulses are 2+ on the right side and 2+ on the left side.  ?   Heart sounds: No murmur heard. ?Pulmonary:  ?   Effort: Pulmonary effort is normal. No respiratory distress.  ?   Breath sounds: Normal breath sounds.  ?Abdominal:  ?   Palpations: Abdomen is soft. There is no hepatomegaly or mass.  ?   Tenderness: There is no abdominal tenderness.  ?Genitourinary: ?   Comments: No concerns. ?Musculoskeletal:     ?   General: No tenderness.  ?   Cervical back: Normal range of motion.  ?   Comments: No major deformities appreciated and no signs of synovitis.  ?Lymphadenopathy:  ?   Cervical: No cervical adenopathy.  ?   Upper Body:  ?   Right upper body: No  supraclavicular adenopathy.  ?   Left upper body: No supraclavicular adenopathy.  ?Skin: ?   General: Skin is warm.  ?   Findings: No erythema.  ?Neurological:  ?   General: No focal deficit present.  ?   Mental Status: He is alert and oriented to person, place, and time.  ?   Cranial Nerves: No cranial nerve deficit.  ?   Sensory: No sensory deficit.  ?   Coordination: Coordination normal.  ?   Gait: Gait normal.  ?   Deep Tendon Reflexes:  ?  Reflex Scores: ?     Bicep reflexes are 2+ on the right side and 2+ on the left side. ?     Patellar reflexes are 2+ on the right side and 2+ on the left side. ?Psychiatric:     ?   Mood and Affect: Affect normal. Mood is anxious.  ? ?ASSESSMENT AND PLAN: ? ?[name] was seen today for annual exam. ? ?Diagnoses and all orders for this visit: ?Orders Placed This Encounter  ?Procedures  ? Tdap vaccine greater than or equal to 7yo IM  ? Comprehensive metabolic panel  ? Hemoglobin A1c  ? Lipid panel  ? CBC  ? Hepatitis C antibody  ? Ambulatory referral to Gastroenterology  ? EKG 12-Lead  ? ?Lab Results  ?Component Value Date  ? WBC 7.3 03/28/2021  ? HGB 15.8 03/28/2021  ? HCT 46.2 03/28/2021  ? MCV 91.2 03/28/2021  ? PLT 222.0 03/28/2021  ? ?Lab Results  ?Component Value Date  ? CREATININE 1.08 03/28/2021  ? BUN 17 03/28/2021  ? NA 139 03/28/2021  ? K 4.5 03/28/2021  ? CL 102 03/28/2021  ? CO2 30 03/28/2021  ? ?Lab Results  ?Component Value Date  ? ALT 19 03/28/2021  ? AST 16 03/28/2021  ? ALKPHOS 60 03/28/2021  ? BILITOT 0.6 03/28/2021  ? ?Lab Results  ?Component Value Date  ? HGBA1C 5.7 03/28/2021  ? ?Lab Results  ?Component Value Date  ? CHOL 236 (H) 03/28/2021  ? HDL 64.40 03/28/2021  ? LDLCALC 138 (H) 03/28/2021  ? TRIG 170.0 (H) 03/28/2021  ? CHOLHDL 4 03/28/2021  ? ?Routine general medical examination at a health care facility ?We discussed the importance of regular physical activity and healthy diet for prevention of chronic illness and/or complications. ?Preventive  guidelines reviewed. ?Vaccination updated. ?Next CPE in a year. ? ?The 10-year ASCVD risk score (Arnett DK, et al., 2019) is: 2.4% ?  Values used to calculate the score: ?    Age: 56 years ?    Sex: Male ?    Is

## 2021-03-28 ENCOUNTER — Ambulatory Visit (INDEPENDENT_AMBULATORY_CARE_PROVIDER_SITE_OTHER): Payer: No Typology Code available for payment source | Admitting: Family Medicine

## 2021-03-28 ENCOUNTER — Encounter: Payer: Self-pay | Admitting: Family Medicine

## 2021-03-28 VITALS — BP 128/80 | HR 63 | Resp 12 | Ht 73.0 in | Wt 215.2 lb

## 2021-03-28 DIAGNOSIS — Z0000 Encounter for general adult medical examination without abnormal findings: Secondary | ICD-10-CM

## 2021-03-28 DIAGNOSIS — Z1329 Encounter for screening for other suspected endocrine disorder: Secondary | ICD-10-CM | POA: Diagnosis not present

## 2021-03-28 DIAGNOSIS — Z1159 Encounter for screening for other viral diseases: Secondary | ICD-10-CM

## 2021-03-28 DIAGNOSIS — Z130 Encounter for screening for diseases of the blood and blood-forming organs and certain disorders involving the immune mechanism: Secondary | ICD-10-CM

## 2021-03-28 DIAGNOSIS — Z13228 Encounter for screening for other metabolic disorders: Secondary | ICD-10-CM | POA: Diagnosis not present

## 2021-03-28 DIAGNOSIS — R0789 Other chest pain: Secondary | ICD-10-CM | POA: Diagnosis not present

## 2021-03-28 DIAGNOSIS — Z23 Encounter for immunization: Secondary | ICD-10-CM

## 2021-03-28 DIAGNOSIS — Z13220 Encounter for screening for lipoid disorders: Secondary | ICD-10-CM | POA: Diagnosis not present

## 2021-03-28 DIAGNOSIS — Z1211 Encounter for screening for malignant neoplasm of colon: Secondary | ICD-10-CM

## 2021-03-28 LAB — COMPREHENSIVE METABOLIC PANEL
ALT: 19 U/L (ref 0–53)
AST: 16 U/L (ref 0–37)
Albumin: 4.5 g/dL (ref 3.5–5.2)
Alkaline Phosphatase: 60 U/L (ref 39–117)
BUN: 17 mg/dL (ref 6–23)
CO2: 30 mEq/L (ref 19–32)
Calcium: 9.8 mg/dL (ref 8.4–10.5)
Chloride: 102 mEq/L (ref 96–112)
Creatinine, Ser: 1.08 mg/dL (ref 0.40–1.50)
GFR: 82.02 mL/min (ref >60.00)
Glucose, Bld: 95 mg/dL (ref 70–99)
Potassium: 4.5 mEq/L (ref 3.5–5.1)
Sodium: 139 mEq/L (ref 135–145)
Total Bilirubin: 0.6 mg/dL (ref 0.2–1.2)
Total Protein: 7.4 g/dL (ref 6.0–8.3)

## 2021-03-28 LAB — LIPID PANEL
Cholesterol: 236 mg/dL — ABNORMAL HIGH (ref 0–200)
HDL: 64.4 mg/dL (ref >39.00)
LDL Cholesterol: 138 mg/dL — ABNORMAL HIGH (ref 0–99)
NonHDL: 171.98
Total CHOL/HDL Ratio: 4
Triglycerides: 170 mg/dL — ABNORMAL HIGH (ref 0.0–149.0)
VLDL: 34 mg/dL (ref 0.0–40.0)

## 2021-03-28 LAB — CBC
HCT: 46.2 % (ref 39.0–52.0)
Hemoglobin: 15.8 g/dL (ref 13.0–17.0)
MCHC: 34.2 g/dL (ref 30.0–36.0)
MCV: 91.2 fl (ref 78.0–100.0)
Platelets: 222 10*3/uL (ref 150.0–400.0)
RBC: 5.07 Mil/uL (ref 4.22–5.81)
RDW: 12.4 % (ref 11.5–15.5)
WBC: 7.3 10*3/uL (ref 4.0–10.5)

## 2021-03-28 LAB — HEMOGLOBIN A1C: Hgb A1c MFr Bld: 5.7 % (ref 4.6–6.5)

## 2021-03-28 NOTE — Patient Instructions (Addendum)
A few things to remember from today's visit: ? ?Routine general medical examination at a health care facility ? ?Screening for lipid disorders - Plan: Lipid panel ? ?Screening for endocrine, metabolic, and immunity disorder - Plan: Comprehensive metabolic panel, Hemoglobin A1c ? ?Chest discomfort - Plan: EKG 12-Lead, CBC ? ?Colon cancer screening - Plan: Ambulatory referral to Gastroenterology ? ?Encounter for HCV screening test for low risk patient - Plan: Hepatitis C antibody ? ?Do not use My Chart to request refills or for acute issues that need immediate attention. ?  ?Please be sure medication list is accurate. ?If a new problem present, please set up appointment sooner than planned today. ?Health Maintenance, Male ?Adopting a healthy lifestyle and getting preventive care are important in promoting health and wellness. Ask your health care provider about: ?The right schedule for you to have regular tests and exams. ?Things you can do on your own to prevent diseases and keep yourself healthy. ?What should I know about diet, weight, and exercise? ?Eat a healthy diet ? ?Eat a diet that includes plenty of vegetables, fruits, low-fat dairy products, and lean protein. ?Do not eat a lot of foods that are high in solid fats, added sugars, or sodium. ?Maintain a healthy weight ?Body mass index (BMI) is a measurement that can be used to identify possible weight problems. It estimates body fat based on height and weight. Your health care provider can help determine your BMI and help you achieve or maintain a healthy weight. ?Get regular exercise ?Get regular exercise. This is one of the most important things you can do for your health. Most adults should: ?Exercise for at least 150 minutes each week. The exercise should increase your heart rate and make you sweat (moderate-intensity exercise). ?Do strengthening exercises at least twice a week. This is in addition to the moderate-intensity exercise. ?Spend less time  sitting. Even light physical activity can be beneficial. ?Watch cholesterol and blood lipids ?Have your blood tested for lipids and cholesterol at 47 years of age, then have this test every 5 years. ?You may need to have your cholesterol levels checked more often if: ?Your lipid or cholesterol levels are high. ?You are older than 47 years of age. ?You are at high risk for heart disease. ?What should I know about cancer screening? ?Many types of cancers can be detected early and may often be prevented. Depending on your health history and family history, you may need to have cancer screening at various ages. This may include screening for: ?Colorectal cancer. ?Prostate cancer. ?Skin cancer. ?Lung cancer. ?What should I know about heart disease, diabetes, and high blood pressure? ?Blood pressure and heart disease ?High blood pressure causes heart disease and increases the risk of stroke. This is more likely to develop in people who have high blood pressure readings or are overweight. ?Talk with your health care provider about your target blood pressure readings. ?Have your blood pressure checked: ?Every 3-5 years if you are 77-9 years of age. ?Every year if you are 57 years old or older. ?If you are between the ages of 98 and 68 and are a current or former smoker, ask your health care provider if you should have a one-time screening for abdominal aortic aneurysm (AAA). ?Diabetes ?Have regular diabetes screenings. This checks your fasting blood sugar level. Have the screening done: ?Once every three years after age 55 if you are at a normal weight and have a low risk for diabetes. ?More often and at a younger age  if you are overweight or have a high risk for diabetes. ?What should I know about preventing infection? ?Hepatitis B ?If you have a higher risk for hepatitis B, you should be screened for this virus. Talk with your health care provider to find out if you are at risk for hepatitis B infection. ?Hepatitis  C ?Blood testing is recommended for: ?Everyone born from [birth year] through 1966. ?Anyone with known risk factors for hepatitis C. ?Sexually transmitted infections (STIs) ?You should be screened each year for STIs, including gonorrhea and chlamydia, if: ?You are sexually active and are younger than 47 years of age. ?You are older than 47 years of age and your health care provider tells you that you are at risk for this type of infection. ?Your sexual activity has changed since you were last screened, and you are at increased risk for chlamydia or gonorrhea. Ask your health care provider if you are at risk. ?Ask your health care provider about whether you are at high risk for HIV. Your health care provider may recommend a prescription medicine to help prevent HIV infection. If you choose to take medicine to prevent HIV, you should first get tested for HIV. You should then be tested every 3 months for as long as you are taking the medicine. ?Follow these instructions at home: ?Alcohol use ?Do not drink alcohol if your health care provider tells you not to drink. ?If you drink alcohol: ?Limit how much you have to 0-2 drinks a day. ?Know how much alcohol is in your drink. In the [place] one drink equals one 12 oz bottle of beer (355 mL), one 5 oz glass of wine (148 mL), or one 1? oz glass of hard liquor (44 mL). ?Lifestyle ?Do not use any products that contain nicotine or tobacco. These products include cigarettes, chewing tobacco, and vaping devices, such as e-cigarettes. If you need help quitting, ask your health care provider. ?Do not use street drugs. ?Do not share needles. ?Ask your health care provider for help if you need support or information about quitting drugs. ?General instructions ?Schedule regular health, dental, and eye exams. ?Stay current with your vaccines. ?Tell your health care provider if: ?You often feel depressed. ?You have ever been abused or do not feel safe at home. ?Summary ?Adopting a healthy  lifestyle and getting preventive care are important in promoting health and wellness. ?Follow your health care provider's instructions about healthy diet, exercising, and getting tested or screened for diseases. ?Follow your health care provider's instructions on monitoring your cholesterol and blood pressure. ?This information is not intended to replace advice given to you by your health care provider. Make sure you discuss any questions you have with your health care provider. ?Document Revised: 05/20/2020 Document Reviewed: 05/20/2020 ?Elsevier Patient Education ? [employer]. ? ?

## 2021-03-31 LAB — HEPATITIS C ANTIBODY
Hepatitis C Ab: NONREACTIVE
SIGNAL TO CUT-OFF: <0.02 (ref <1.00)

## 2022-05-08 NOTE — Progress Notes (Deleted)
   ACUTE VISIT No chief complaint on file.  HPI: [name] is a 47 y.o. male, who is here today complaining of *** HPI  Review of Systems See other pertinent positives and negatives in HPI.  Current Outpatient Medications on File Prior to Visit  Medication Sig Dispense Refill   albuterol (PROVENTIL HFA;VENTOLIN HFA) 108 (90 Base) MCG/ACT inhaler Inhale 2 puffs into the lungs every 6 (six) hours as needed for wheezing or shortness of breath. 1 Inhaler 3   clotrimazole-betamethasone (LOTRISONE) cream Apply 1 application topically 2 (two) times daily. 45 g 1   ketoconazole (NIZORAL) 2 % shampoo Apply 1 application topically 2 (two) times a week. 120 mL 1   sertraline (ZOLOFT) 25 MG tablet TAKE 1 TABLET (25 MG TOTAL) BY MOUTH DAILY. 30 tablet 1   No current facility-administered medications on file prior to visit.    Past Medical History:  Diagnosis Date   Allergy    Asthma    Back pain    No Known Allergies  Social History   Socioeconomic History   Marital status: Married    Spouse name: Not on file   Number of children: Not on file   Years of education: Not on file   Highest education level: Not on file  Occupational History   Not on file  Tobacco Use   Smoking status: Never   Smokeless tobacco: Never  Substance and Sexual Activity   Alcohol use: Yes   Drug use: No   Sexual activity: Not on file  Other Topics Concern   Not on file  Social History Narrative   Not on file   Social Determinants of Health   Financial Resource Strain: Not on file  Food Insecurity: Not on file  Transportation Needs: Not on file  Physical Activity: Not on file  Stress: Not on file  Social Connections: Not on file    There were no vitals filed for this visit. There is no height or weight on file to calculate BMI.  Physical Exam  ASSESSMENT AND PLAN: There are no diagnoses linked to this encounter.  No follow-ups on file.  Betty G. [place], MD  [facility]. [place] [facility].  Discharge Instructions   None

## 2022-05-11 ENCOUNTER — Ambulatory Visit: Payer: No Typology Code available for payment source | Admitting: Family Medicine

## 2022-05-13 ENCOUNTER — Encounter: Payer: Self-pay | Admitting: Family Medicine

## 2022-05-13 NOTE — Telephone Encounter (Signed)
I called and spoke with patient. He is aware that he had a Tdap last year and the appt for Friday is not needed. Appt canceled.

## 2022-05-15 ENCOUNTER — Ambulatory Visit: Payer: No Typology Code available for payment source | Admitting: Family Medicine
